# Patient Record
Sex: Male | Born: 1935 | Race: Black or African American | Hispanic: No | Marital: Married | Smoking: Never smoker
Health system: Southern US, Community
[De-identification: ages and names within clinical notes are randomized; demographics above are authoritative.]

## PROBLEM LIST (undated history)

## (undated) DIAGNOSIS — I639 Cerebral infarction, unspecified: Secondary | ICD-10-CM

## (undated) DIAGNOSIS — Z982 Presence of cerebrospinal fluid drainage device: Secondary | ICD-10-CM

## (undated) DIAGNOSIS — I10 Essential (primary) hypertension: Secondary | ICD-10-CM

## (undated) DIAGNOSIS — L89159 Pressure ulcer of sacral region, unspecified stage: Secondary | ICD-10-CM

## (undated) DIAGNOSIS — K219 Gastro-esophageal reflux disease without esophagitis: Secondary | ICD-10-CM

## (undated) DIAGNOSIS — K649 Unspecified hemorrhoids: Secondary | ICD-10-CM

## (undated) DIAGNOSIS — J45909 Unspecified asthma, uncomplicated: Secondary | ICD-10-CM

## (undated) DIAGNOSIS — N400 Enlarged prostate without lower urinary tract symptoms: Secondary | ICD-10-CM

---

## 2015-01-17 ENCOUNTER — Encounter: Payer: Medicare Other | Attending: Surgery | Admitting: Surgery

## 2015-01-17 DIAGNOSIS — E440 Moderate protein-calorie malnutrition: Secondary | ICD-10-CM | POA: Diagnosis not present

## 2015-01-17 DIAGNOSIS — L89154 Pressure ulcer of sacral region, stage 4: Secondary | ICD-10-CM | POA: Insufficient documentation

## 2015-01-17 DIAGNOSIS — L89144 Pressure ulcer of left lower back, stage 4: Secondary | ICD-10-CM | POA: Diagnosis not present

## 2015-01-17 DIAGNOSIS — G8190 Hemiplegia, unspecified affecting unspecified side: Secondary | ICD-10-CM | POA: Diagnosis not present

## 2015-01-17 DIAGNOSIS — Z87891 Personal history of nicotine dependence: Secondary | ICD-10-CM | POA: Diagnosis not present

## 2015-01-17 DIAGNOSIS — G919 Hydrocephalus, unspecified: Secondary | ICD-10-CM | POA: Diagnosis not present

## 2015-01-17 DIAGNOSIS — I10 Essential (primary) hypertension: Secondary | ICD-10-CM | POA: Insufficient documentation

## 2015-01-18 NOTE — Progress Notes (Signed)
[name] [name] ([id number]) Visit Report for 01/17/2015 Abuse/Suicide Risk Screen Details Patient Name: [name] [name] Date of Service: 01/17/2015 1:00 PM Medical Record Number: [id number] Patient Account Number: [id number] Date of Birth/Sex: May 01, 1931 (81 y.o. Male) Treating RN: Curtis Sites Primary Care Physician: Audree Bane Other Clinician: Referring Physician: Audree Bane Treating Physician/Extender: Rudene Re in Treatment: 0 Abuse/Suicide Risk Screen Items Answer ABUSE/SUICIDE RISK SCREEN: Has anyone close to you tried to hurt or harm you recentlyo No Do you feel uncomfortable with anyone in your familyo No Has anyone forced you do things that you didnot want to doo No Do you have any thoughts of harming yourselfo No Patient displays signs or symptoms of abuse and/or neglect. No Electronic Signature(s) Signed: 01/17/2015 4:43:54 PM By: Curtis Sites Entered By: Curtis Sites on 01/17/2015 13:54:09 [name] [name] ([id number]) -------------------------------------------------------------------------------- Activities of Daily Living Details Patient Name: [name] [name] Date of Service: 01/17/2015 1:00 PM Medical Record Number: [id number] Patient Account Number: [id number] Date of Birth/Sex: 1931-01-22 (81 y.o. Male) Treating RN: Curtis Sites Primary Care Physician: Audree Bane Other Clinician: Referring Physician: Audree Bane Treating Physician/Extender: Rudene Re in Treatment: 0 Activities of Daily Living Items Answer Activities of Daily Living (Please select one for each item) Drive Automobile Not Able Take Medications Need Assistance Use Telephone Need Assistance Care for Appearance Need Assistance Use Toilet Not Able Bath / Shower Need Assistance Dress Self Need Assistance Feed Self Need Assistance Walk Not Able Get In / Out Bed Need Assistance Housework Not Able Prepare Meals Need Assistance Handle Money Need Assistance Shop for Self Need  Assistance Electronic Signature(s) Signed: 01/17/2015 4:43:54 PM By: Curtis Sites Entered By: Curtis Sites on 01/17/2015 13:56:02 [name] [name] ([id number]) -------------------------------------------------------------------------------- Education Assessment Details Patient Name: [name] [name] Date of Service: 01/17/2015 1:00 PM Medical Record Number: [id number] Patient Account Number: [id number] Date of Birth/Sex: 01/13/1931 (81 y.o. Male) Treating RN: Curtis Sites Primary Care Physician: Audree Bane Other Clinician: Referring Physician: Audree Bane Treating Physician/Extender: Rudene Re in Treatment: 0 Primary Learner Assessed: Caregiver dtr Reason Patient is not Primary Learner: pt is post CVA Learning Preferences/Education Level/Primary Language Learning Preference: Explanation, Demonstration Highest Education Level: High School Preferred Language: English Cognitive Barrier Assessment/Beliefs Language Barrier: No Translator Needed: No Memory Deficit: No Emotional Barrier: No Cultural/Religious Beliefs Affecting Medical No Care: Physical Barrier Assessment Impaired Vision: No Impaired Hearing: No Decreased Hand dexterity: No Knowledge/Comprehension Assessment Knowledge Level: Medium Comprehension Level: Medium Ability to understand written Medium instructions: Ability to understand verbal Medium instructions: Motivation Assessment Anxiety Level: Calm Cooperation: Cooperative Education Importance: Acknowledges Need Interest in Health Problems: Asks Questions Perception: Coherent Willingness to Engage in Self- Medium Management Activities: Readiness to Engage in Self- Medium Management Activities: [name] [name] ([id number]) Electronic Signature(s) Signed: 01/17/2015 4:43:54 PM By: Curtis Sites Entered By: Curtis Sites on 01/17/2015 13:56:34 [name] [name]  ([id number]) -------------------------------------------------------------------------------- Fall Risk Assessment Details Patient Name: [name] [name] Date of Service: 01/17/2015 1:00 PM Medical Record Number: [id number] Patient Account Number: [id number] Date of Birth/Sex: [birth year] (81 y.o. Male) Treating RN: Curtis Sites Primary Care Physician: Audree Bane Other Clinician: Referring Physician: Audree Bane Treating Physician/Extender: Rudene Re in Treatment: 0 Fall Risk Assessment Items FALL RISK ASSESSMENT: History of falling - immediate or within 3 months 0 No Secondary diagnosis 0 No Ambulatory aid None/bed rest/wheelchair/nurse 0 Yes Crutches/cane/walker 0 No Furniture 0 No IV Access/Saline Lock 0 No Gait/Training Normal/bed rest/immobile 0 Yes Weak 0 No Impaired 0 No Mental Status Oriented to own ability 0 Yes Electronic Signature(s) Signed:  01/17/2015 4:43:54 PM By: Curtis Sitesorthy, [name] Entered By: Curtis Sitesorthy, [name] on 01/17/2015 13:56:47 [name] [name] ([id number]) -------------------------------------------------------------------------------- Nutrition Risk Assessment Details Patient Name: [name] [name] Date of Service: 01/17/2015 1:00 PM Medical Record Number: [id number] Patient Account Number: [id number] Date of Birth/Sex: 05/13/1931 4(81 y.o. Male) Treating RN: Curtis Sitesorthy, [name] Primary Care Physician: Audree BaneKING, PETER Other Clinician: Referring Physician: Audree BaneKING, PETER Treating Physician/Extender: Rudene ReBritto, Errol Weeks in Treatment: 0 Height (in): Weight (lbs): Body Mass Index (BMI): Nutrition Risk Assessment Items NUTRITION RISK SCREEN: I have an illness or condition that made me change the kind and/or 0 No amount of food I eat I eat fewer than two meals per day 0 No I eat few fruits and vegetables, or milk products 0 No I have three or more drinks of beer, liquor or wine almost every day 0 No I have tooth or mouth problems that make it hard for me to eat 0 No I  don't always have enough money to buy the food I need 0 No I eat alone most of the time 0 No I take three or more different prescribed or over-the-counter drugs a 1 Yes day Without wanting to, I have lost or gained 10 pounds in the last six 0 No months I am not always physically able to shop, cook and/or feed myself 2 Yes Nutrition Protocols Good Risk Protocol Provide education on Moderate Risk Protocol 0 nutrition Electronic Signature(s) Signed: 01/17/2015 4:43:54 PM By: Curtis Sitesorthy, [name] Entered By: Curtis Sitesorthy, [name] on 01/17/2015 13:57:12

## 2015-01-18 NOTE — Progress Notes (Signed)
[name] [name], [name] [name] ([id number]) Visit Report for 01/17/2015 Allergy List Details Patient Name: [name] [name], [name] [name] Date of Service: 01/17/2015 1:00 PM Medical Record Number: [id number] Patient Account Number: [id number] Date of Birth/Sex: February 01, 1931 (81 y.o. Male) Treating RN: Curtis Sites Primary Care Physician: Audree Bane Other Clinician: Referring Physician: Audree Bane Treating Physician/Extender: Rudene Re in Treatment: 0 Allergies Active Allergies No Known Allergies Allergy Notes Electronic Signature(s) Signed: 01/17/2015 4:43:54 PM By: Curtis Sites Entered By: Curtis Sites on 01/17/2015 13:48:20 [name] [name], [name] [name] ([id number]) -------------------------------------------------------------------------------- Arrival Information Details Patient Name: [name] [name], [name] [name] Date of Service: 01/17/2015 1:00 PM Medical Record Number: [id number] Patient Account Number: [id number] Date of Birth/Sex: April 03, 1931 (81 y.o. Male) Treating RN: Huel Coventry Primary Care Physician: Audree Bane Other Clinician: Referring Physician: Audree Bane Treating Physician/Extender: Rudene Re in Treatment: 0 Visit Information Patient Arrived: Wheel Chair Arrival Time: 13:15 Accompanied By: daughter, Theotis Transfer Assistance: Michiel Sites Lift Patient Identification Verified: Yes Secondary Verification Process Yes Completed: Patient Has Alerts: Yes Electronic Signature(s) Signed: 01/17/2015 4:48:19 PM By: Elliot Gurney, RN, BSN, Kim RN, BSN Entered By: Elliot Gurney, RN, BSN, Kim on 01/17/2015 13:32:30 Lyles, Dario ([id number]) -------------------------------------------------------------------------------- Clinic Level of Care Assessment Details Patient Name: [name] [name], [name] [name] Date of Service: 01/17/2015 1:00 PM Medical Record Number: [id number] Patient Account Number: [id number] Date of Birth/Sex: 10-Apr-1931 (81 y.o. Male) Treating RN: Curtis Sites Primary Care Physician: Audree Bane Other Clinician: Referring Physician:  Audree Bane Treating Physician/Extender: Rudene Re in Treatment: 0 Clinic Level of Care Assessment Items TOOL 2 Quantity Score  - Use when only an EandM is performed on the INITIAL visit 0 ASSESSMENTS - Nursing Assessment / Reassessment X - General Physical Exam (combine w/ comprehensive assessment (listed just 1 20 below) when performed on new pt. evals) X - Comprehensive Assessment (HX, ROS, Risk Assessments, Wounds Hx, etc.) 1 25 ASSESSMENTS - Wound and Skin Assessment / Reassessment X - Simple Wound Assessment / Reassessment - one wound 1 5  - Complex Wound Assessment / Reassessment - multiple wounds 0  - Dermatologic / Skin Assessment (not related to wound area) 0 ASSESSMENTS - Ostomy and/or Continence Assessment and Care  - Incontinence Assessment and Management 0  - Ostomy Care Assessment and Management (repouching, etc.) 0 PROCESS - Coordination of Care X - Simple Patient / Family Education for ongoing care 1 15  - Complex (extensive) Patient / Family Education for ongoing care 0 X - Staff obtains Chiropractor, Records, Test Results / Process Orders 1 10  - Staff telephones HHA, Nursing Homes / Clarify orders / etc 0  - Routine Transfer to another Facility (non-emergent condition) 0  - Routine Hospital Admission (non-emergent condition) 0 X - New Admissions / Manufacturing engineer / Ordering NPWT, Apligraf, etc. 1 15  - Emergency Hospital Admission (emergent condition) 0 X - Simple Discharge Coordination 1 10 [name] [name], [name] [name] ([id number])  - Complex (extensive) Discharge Coordination 0 PROCESS - Special Needs  - Pediatric / Minor Patient Management 0  - Isolation Patient Management 0  - Hearing / Language / Visual special needs 0  - Assessment of Community assistance (transportation, D/C planning, etc.) 0  - Additional assistance / Altered mentation 0  - Support Surface(s) Assessment (bed, cushion, seat, etc.) 0 INTERVENTIONS - Wound  Cleansing / Measurement X - Wound Imaging (photographs - any number of wounds) 1 5  - Wound Tracing (instead of photographs) 0 X - Simple Wound Measurement - one wound 1 5  - Complex Wound Measurement - multiple wounds 0 X - Simple Wound Cleansing -  one wound 1 5 []  - Complex Wound Cleansing - multiple wounds 0 INTERVENTIONS - Wound Dressings []  - Small Wound Dressing one or multiple wounds 0 X - Medium Wound Dressing one or multiple wounds 1 15 []  - Large Wound Dressing one or multiple wounds 0 []  - Application of Medications - injection 0 INTERVENTIONS - Miscellaneous []  - External ear exam 0 []  - Specimen Collection (cultures, biopsies, blood, body fluids, etc.) 0 []  - Specimen(s) / Culture(s) sent or taken to Lab for analysis 0 []  - Patient Transfer (multiple staff / Michiel Sites Lift / Similar devices) 0 []  - Simple Staple / Suture removal (25 or less) 0 []  - Complex Staple / Suture removal (26 or more) 0 [name] [name], [name] [name] ([id number]) []  - Hypo / Hyperglycemic Management (close monitor of Blood Glucose) 0 []  - Ankle / Brachial Index (ABI) - do not check if billed separately 0 Has the patient been seen at the hospital within the last three years: Yes Total Score: 130 Level Of Care: New/Established - Level 4 Electronic Signature(s) Signed: 01/17/2015 4:36:47 PM By: Curtis Sites Entered By: Curtis Sites on 01/17/2015 16:36:47 [name] [name], [name] [name] ([id number]) -------------------------------------------------------------------------------- Encounter Discharge Information Details Patient Name: [name] [name], [name] [name] Date of Service: 01/17/2015 1:00 PM Medical Record Number: [id number] Patient Account Number: [id number] Date of Birth/Sex: 08/18/1930 (81 y.o. Male) Treating RN: Curtis Sites Primary Care Physician: Audree Bane Other Clinician: Referring Physician: Audree Bane Treating Physician/Extender: Rudene Re in Treatment: 0 Encounter Discharge Information Items Discharge Pain Level:  0 Discharge Condition: Stable Ambulatory Status: Wheelchair Nursing Discharge Destination: Home Transportation: Private Auto Accompanied By: dtr Schedule Follow-up Appointment: Yes Medication Reconciliation completed No and provided to Patient/Care Metha Kolasa: Clinical Summary of Care: Electronic Signature(s) Signed: 01/17/2015 4:43:54 PM By: Curtis Sites Entered By: Curtis Sites on 01/17/2015 14:39:10 Winborne, James ([id number]) -------------------------------------------------------------------------------- Lower Extremity Assessment Details Patient Name: [name] [name], [name] [name] Date of Service: 01/17/2015 1:00 PM Medical Record Number: [id number] Patient Account Number: [id number] Date of Birth/Sex: 01-03-1931 (81 y.o. Male) Treating RN: Huel Coventry Primary Care Physician: Audree Bane Other Clinician: Referring Physician: Audree Bane Treating Physician/Extender: Rudene Re in Treatment: 0 Electronic Signature(s) Signed: 01/17/2015 4:48:19 PM By: Elliot Gurney, RN, BSN, Kim RN, BSN Entered By: Elliot Gurney, RN, BSN, Kim on 01/17/2015 13:40:47 [name] [name], [name] [name] ([id number]) -------------------------------------------------------------------------------- Multi Wound Chart Details Patient Name: [name] [name], [name] [name] Date of Service: 01/17/2015 1:00 PM Medical Record Number: [id number] Patient Account Number: [id number] Date of Birth/Sex: June 24, 1931 (81 y.o. Male) Treating RN: Curtis Sites Primary Care Physician: Audree Bane Other Clinician: Referring Physician: Audree Bane Treating Physician/Extender: Rudene Re in Treatment: 0 Vital Signs Height(in): Pulse(bpm): 113 Weight(lbs): Blood Pressure 131/42 (mmHg): Body Mass Index(BMI): Temperature(F): 98.1 Respiratory Rate 20 (breaths/min): Photos: [1:No Photos] [N/A:N/A] Wound Location: [1:Sacrum - Medial] [N/A:N/A] Wounding Event: [1:Gradually Appeared] [N/A:N/A] Primary Etiology: [1:Pressure Ulcer] [N/A:N/A] Date Acquired: [1:09/24/2014]  [N/A:N/A] Weeks of Treatment: [1:0] [N/A:N/A] Wound Status: [1:Open] [N/A:N/A] Measurements L x W x D 12.8x9.2x1.3 [N/A:N/A] (cm) Area (cm) : [1:92.488] [N/A:N/A] Volume (cm) : [1:120.235] [N/A:N/A] % Reduction in Area: [1:0.00%] [N/A:N/A] % Reduction in Volume: 0.00% [N/A:N/A] Position 1 (o'clock): 12 Maximum Distance 1 6.5 (cm): Tunneling: [1:Yes] [N/A:N/A] Classification: [1:Category/Stage III] [N/A:N/A] Exudate Amount: [1:Large] [N/A:N/A] Exudate Type: [1:Serous] [N/A:N/A] Exudate Color: [1:amber] [N/A:N/A] Wound Margin: [1:Flat and Intact] [N/A:N/A] Granulation Amount: [1:Large (67-100%)] [N/A:N/A] Granulation Quality: [1:Red] [N/A:N/A] Necrotic Amount: [1:Small (1-33%)] [N/A:N/A] Exposed Structures: [1:Fascia: No Fat: No Tendon: No Muscle: No Joint: No] [N/A:N/A] Bone: No Limited to Skin Breakdown Epithelialization: None N/A N/A Periwound Skin Texture:  Edema: No N/A N/A Excoriation: No Induration: No Callus: No Crepitus: No Fluctuance: No Friable: No Rash: No Scarring: No Periwound Skin Maceration: No N/A N/A Moisture: Moist: No Dry/Scaly: No Periwound Skin Color: Atrophie Blanche: No N/A N/A Cyanosis: No Ecchymosis: No Erythema: No Hemosiderin Staining: No Mottled: No Pallor: No Rubor: No Tenderness on Yes N/A N/A Palpation: Wound Preparation: Ulcer Cleansing: N/A N/A Rinsed/Irrigated with Saline Topical Anesthetic Applied: Other: lidocaine 4% Treatment Notes Electronic Signature(s) Signed: 01/17/2015 4:43:54 PM By: Curtis Sitesorthy, Joanna Entered By: Curtis Sitesorthy, Joanna on 01/17/2015 14:10:03 Homesley, Aadil ([id number]) -------------------------------------------------------------------------------- Multi-Disciplinary Care Plan Details Patient Name: [name] [name], [name] [name] Date of Service: 01/17/2015 1:00 PM Medical Record Number: [id number] Patient Account Number: [id number] Date of Birth/Sex: 08/16/1930 34(81 y.o. Male) Treating RN: Curtis Sitesorthy, Joanna Primary Care  Physician: Audree BaneKING, PETER Other Clinician: Referring Physician: Audree BaneKING, PETER Treating Physician/Extender: Rudene ReBritto, Errol Weeks in Treatment: 0 Active Inactive Orientation to the Wound Care Program Nursing Diagnoses: Knowledge deficit related to the wound healing center program Goals: Patient/caregiver will verbalize understanding of the Wound Healing Center Program Date Initiated: 01/17/2015 Goal Status: Active Interventions: Provide education on orientation to the wound center Notes: Pressure Nursing Diagnoses: Potential for impaired tissue integrity related to pressure, friction, moisture, and shear Goals: Patient will remain free from development of additional pressure ulcers Date Initiated: 01/17/2015 Goal Status: Active Interventions: Provide education on pressure ulcers Notes: Wound/Skin Impairment Nursing Diagnoses: Impaired tissue integrity Goals: Ulcer/skin breakdown will have a volume reduction of 30% by week 4 Date Initiated: 01/17/2015 Henken, Jeanmarc ([id number]) Goal Status: Active Interventions: Assess ulceration(s) every visit Notes: Electronic Signature(s) Signed: 01/17/2015 4:43:54 PM By: Curtis Sitesorthy, Joanna Entered By: Curtis Sitesorthy, Joanna on 01/17/2015 14:09:51 Kudo, Mosie ([id number]) -------------------------------------------------------------------------------- Pain Assessment Details Patient Name: [name] [name], [name] [name] Date of Service: 01/17/2015 1:00 PM Medical Record Number: [id number] Patient Account Number: [id number] Date of Birth/Sex: 12/16/1930 [age] y.o. Male) Treating RN: Huel CoventryWoody, Kim Primary Care Physician: Audree BaneKING, PETER Other Clinician: Referring Physician: Audree BaneKING, PETER Treating Physician/Extender: Rudene ReBritto, Errol Weeks in Treatment: 0 Active Problems Location of Pain Severity and Description of Pain Patient Has Paino Yes Site Locations Pain Location: Pain in Ulcers With Dressing Change: Yes Duration of the Pain. Constant / Intermittento Constant Pain Management and  Medication Current Pain Management: Notes Patient is in pain. But due to speech, unable to understand exactly where the pain is located Psychologist, prison [facility] probation services[facility] Signature(s) Signed: 01/17/2015 4:48:19 PM By: Elliot GurneyWoody, RN, BSN, Kim RN, BSN Entered By: Elliot GurneyWoody, RN, BSN, Kim on 01/17/2015 13:34:29 [name] [name], [name] [name] ([id number]) -------------------------------------------------------------------------------- Patient/Caregiver Education Details Patient Name: [name] [name], Enes Date of Service: 01/17/2015 1:00 PM Medical Record Number: [id number] Patient Account Number: [id number] Date of Birth/Gender: 01/27/1931 [age] y.o. Male) Treating RN: Curtis Sitesorthy, Joanna Primary Care Physician: Audree BaneKING, PETER Other Clinician: Referring Physician: Audree BaneKING, PETER Treating Physician/Extender: Rudene ReBritto, Errol Weeks in Treatment: 0 Education Assessment Education Provided To: Caregiver Education Topics Provided Wound/Skin Impairment: Handouts: Other: wound care as ordered Methods: Demonstration, Explain/Verbal Responses: State content correctly Electronic Signature(s) Signed: 01/17/2015 4:43:54 PM By: Curtis Sitesorthy, Joanna Entered By: Curtis Sitesorthy, Joanna on 01/17/2015 14:39:26 Fabry, Corney ([id number]) -------------------------------------------------------------------------------- Wound Assessment Details Patient Name: Kaczmarek, Raheel Date of Service: 01/17/2015 1:00 PM Medical Record Number: [id number] Patient Account Number: [id number] Date of Birth/Sex: 11/11/1930 [age] y.o. Male) Treating RN: Curtis Sitesorthy, Joanna Primary Care Physician: Audree BaneKING, PETER Other Clinician: Referring Physician: Audree BaneKING, PETER Treating Physician/Extender: Rudene ReBritto, Errol Weeks in Treatment: 0 Wound Status Wound Number: 1 Primary Pressure Ulcer Etiology: Wound Location: Sacrum - Medial Wound Open Wounding Event: Gradually Appeared Status: Date Acquired: 09/24/2014 Comorbid Cataracts, Glaucoma, Anemia,  Weeks Of Treatment: 0 History: Hypertension, Osteoarthritis Clustered Wound:  No Photos Photo Uploaded By: Curtis Sites on 01/17/2015 16:15:40 Wound Measurements Length: (cm) 12.8 Width: (cm) 9.2 Depth: (cm) 1.3 Area: (cm) 92.488 Volume: (cm) 120.235 % Reduction in Area: 0% % Reduction in Volume: 0% Epithelialization: None Tunneling: No Undermining: Yes Starting Position (o'clock): 12 Ending Position (o'clock): 4 Maximum Distance: (cm) 7 Wound Description Classification: Category/Stage III Wound Margin: Flat and Intact Exudate Amount: Large Exudate Type: Serous Exudate Color: amber Foul Odor After Cleansing: No Wound Bed Brassfield, Heman ([id number]) Granulation Amount: Large (67-100%) Exposed Structure Granulation Quality: Red Fascia Exposed: No Necrotic Amount: Small (1-33%) Fat Layer Exposed: No Necrotic Quality: Adherent Slough Tendon Exposed: No Muscle Exposed: No Joint Exposed: No Bone Exposed: No Limited to Skin Breakdown Periwound Skin Texture Texture Color No Abnormalities Noted: No No Abnormalities Noted: No Callus: No Atrophie Blanche: No Crepitus: No Cyanosis: No Excoriation: No Ecchymosis: No Fluctuance: No Erythema: No Friable: No Hemosiderin Staining: No Induration: No Mottled: No Localized Edema: No Pallor: No Rash: No Rubor: No Scarring: No Temperature / Pain Moisture Tenderness on Palpation: Yes No Abnormalities Noted: No Dry / Scaly: No Maceration: No Moist: No Wound Preparation Ulcer Cleansing: Rinsed/Irrigated with Saline Topical Anesthetic Applied: Other: lidocaine 4%, Treatment Notes Wound #1 (Medial Sacrum) 1. Cleansed with: Clean wound with Normal Saline 2. Anesthetic Topical Lidocaine 4% cream to wound bed prior to debridement 4. Dressing Applied: Other dressing (specify in notes) 5. Secondary Dressing Applied ABD Pad Notes drawtex Electronic Signature(s) Signed: 01/17/2015 4:43:54 PM By: Curtis Sites Waterworth, Hebert ([id number]) Entered By: Curtis Sites on 01/17/2015  14:14:25 Paradiso, Zaryan ([id number]) -------------------------------------------------------------------------------- Vitals Details Patient Name: Lasker, Maeson Date of Service: 01/17/2015 1:00 PM Medical Record Number: [id number] Patient Account Number: [id number] Date of Birth/Sex: 1931-04-04 (81 y.o. Male) Treating RN: Huel Coventry Primary Care Physician: Audree Bane Other Clinician: Referring Physician: Audree Bane Treating Physician/Extender: Rudene Re in Treatment: 0 Vital Signs Time Taken: 13:34 Temperature (F): 98.1 Pulse (bpm): 113 Respiratory Rate (breaths/min): 20 Blood Pressure (mmHg): 131/42 Reference Range: 80 - 120 mg / dl Electronic Signature(s) Signed: 01/17/2015 4:48:19 PM By: Elliot Gurney, RN, BSN, Kim RN, BSN Entered By: Elliot Gurney, RN, BSN, Kim on 01/17/2015 13:40:38

## 2015-01-20 NOTE — Progress Notes (Signed)
[name], [name] ([id number]) Visit Report for 01/17/2015 Chief Complaint Document Details Patient Name: [name] [name], [name] [name] Date of Service: 01/17/2015 1:00 PM Medical Record Number: [id number] Patient Account Number: [id number] Date of Birth/Sex: 02/03/1931 [age] y.o. Male) Treating RN: Primary Care Physician: Audree BaneKING, PETER Other Clinician: Referring Physician: Audree BaneKING, PETER Treating Physician/Extender: Rudene ReBritto, [name]: 0 Information Obtained from: Caregiver Chief Complaint Patient is at the clinic for treatment of an open pressure ulcer. The patient comes along with his daughter who is the caregiver and lives in an assisted living facility and has had a ulcerative area on the sacrum for the last 4 months. Electronic Signature(s) Signed: 01/17/2015 2:51:21 PM By: Evlyn KannerBritto, Donnisha Besecker MD, FACS Entered By: Evlyn KannerBritto, Kole Hilyard on 01/17/2015 14:51:21 [name], [name] ([id number]) -------------------------------------------------------------------------------- HPI Details Patient Name: [name], [name] Date of Service: 01/17/2015 1:00 PM Medical Record Number: [id number] Patient Account Number: [id number] Date of Birth/Sex: 10/28/1930 [age] y.o. Male) Treating RN: Primary Care Physician: Audree BaneKING, PETER Other Clinician: Referring Physician: Audree BaneKING, PETER Treating Physician/Extender: Rudene ReBritto, Leaann Nevils Weeks in Treatment: 0 History of Present Illness Location: pressure injury to the sacral region and the left low back area. Quality: Patient reports experiencing a dull pain to affected area(s). Severity: Patient states wound are getting worse. Duration: Patient has had the wound for > 3 months prior to seeking treatment at the wound center Timing: Pain in wound is Intermittent (comes and goes Context: The wound appeared gradually over time Modifying Factors: Consults to this date include: wound VAC applications and local care Associated Signs and Symptoms: Wound has significant periowound erythema and localized edema HPI  Description: 81 year old gentleman who had a stroke in February of this year and has been bedbound since then. Since March he has developed a pressure injury and ulceration and has been treated at assisted living with a wound VAC and other supportive care. His had a PEG tube in place and though he eats some food during the day he gets supplementary PEG tube feeds between 6 PM and 6 AM. Has not had diabetes mellitus and was pretty active before his illness. Electronic Signature(s) Signed: 01/17/2015 2:53:06 PM By: Evlyn KannerBritto, Ion Gonnella MD, FACS Entered By: Evlyn KannerBritto, Nayla Dias on 01/17/2015 14:53:06 [name], [name] -------------------------------------------------------------------------------- Physical Exam Details Patient Name: [name], [name] Date of Service: 01/17/2015 1:00 PM Medical Record Number: [id number] Patient Account Number: [id number] Date of Birth/Sex: 07/09/1931 [age] y.o. Male) Treating RN: Primary Care Physician: Audree BaneKING, PETER Other Clinician: Referring Physician: Audree BaneKING, PETER Treating Physician/Extender: Rudene ReBritto, Velma Agnes Weeks in Treatment: 0 Constitutional . Pulse regular. Respirations normal and unlabored. Afebrile. . Eyes Nonicteric. Reactive to light. Ears, Nose, Mouth, and Throat Lips, teeth, and gums WNL.Marland Kitchen. Moist mucosa without lesions . Neck supple and nontender. No palpable supraclavicular or cervical adenopathy. Normal sized without goiter. Respiratory WNL. No retractions.. Cardiovascular Pedal Pulses WNL. No clubbing, cyanosis or edema. Gastrointestinal (GI) Abdomen without masses or tenderness.. No liver or spleen enlargement or tenderness.. Musculoskeletal Adexa without tenderness or enlargement.. Digits and nails w/o clubbing, cyanosis, infection, petechiae, ischemia, or inflammatory conditions.. Integumentary (Hair, Skin) No suspicious lesions. No crepitus or fluctuance. No peri-wound warmth or erythema. No masses.Marland Kitchen. Psychiatric Judgement and insight Intact.. No  evidence of depression, anxiety, or agitation.. Notes Large stage IV decubitus ulcer of the sacrum and adjoining left low back. The superior part of both ulcerations undermine up to 7 cm and this is mainly between the 12:00 and 3:00 position on the right and on the left side it is approximately between the 7:00 and the 1:00 position. The base  of the ulcer looks fairly clean and is not probing down to bone but muscle aches exposed all through. Electronic Signature(s) Signed: 01/17/2015 2:54:27 PM By: Evlyn Kanner MD, FACS Entered By: Evlyn Kanner on 01/17/2015 14:54:26 [name], [name] -------------------------------------------------------------------------------- Physician Orders Details Patient Name: [name], [name] Date of Service: 01/17/2015 1:00 PM Medical Record Number: [id number] Patient Account Number: [id number] Date of Birth/Sex: 1930-10-17 (81 y.o. Male) Treating RN: Curtis Sites Primary Care Physician: Audree Bane Other Clinician: Referring Physician: Audree Bane Treating Physician/Extender: Rudene Re in Treatment: 0 Verbal / Phone Orders: Yes Clinician: Curtis Sites Read Back and Verified: Yes Diagnosis Coding Wound Cleansing Wound #1 Medial Sacrum o Clean wound with Normal Saline. Anesthetic Wound #1 Medial Sacrum o Topical Lidocaine 4% cream applied to wound bed prior to debridement Skin Barriers/Peri-Wound Care Wound #1 Medial Sacrum o Skin Prep Primary Wound Dressing o Iodoform packing Gauze - may use iodoform packing strip to undermining at the top of wound along with green foam and NPWT until drawtex is available o Drawtex - strips of drawtex to undermining at the top of wound with green foam to wound bed with NPWT Secondary Dressing Wound #1 Medial Sacrum o ABD pad - applied at Wound Center; [facility] to reapply NPWT today Dressing Change Frequency Wound #1 Medial Sacrum o Change Dressing Monday, Wednesday,  Friday Follow-up Appointments Wound #1 Medial Sacrum o Return Appointment in 1 week. Additional Orders / Instructions Wound #1 Medial Sacrum o Other: - please add zinc, selenium and vitamin c to patient's diet/medictions Negative Pressure Wound Therapy [name], [name] Wound #1 Medial Sacrum o Wound VAC settings at 125/130 mmHg continuous pressure. Use BLACK/GREEN foam to wound cavity. Use WHITE foam to fill any tunnel/s and/or undermining. Change VAC dressing 3 X WEEK. Change canister as indicated when full. Nurse may titrate settings and frequency of dressing changes as clinically indicated. o Home Health Nurse may d/c VAC for s/s of increased infection, significant wound regression, or uncontrolled drainage. Notify Wound Healing Center at [phone]. o Number of foam/gauze pieces used in the dressing = Consults o Plastic Surgery - this will be set up by [facility] oooo Electronic Signature(s) Signed: 01/17/2015 4:43:54 PM By: Curtis Sites Signed: 01/20/2015 12:34:21 PM By: Evlyn Kanner MD, FACS Entered By: Curtis Sites on 01/17/2015 14:26:35 [name], [name] ([id number]) -------------------------------------------------------------------------------- Problem List Details Patient Name: Hampe, Arhum Date of Service: 01/17/2015 1:00 PM Medical Record Number: [id number] Patient Account Number: [id number] Date of Birth/Sex: 22-Mar-1931 (81 y.o. Male) Treating RN: Primary Care Physician: Audree Bane Other Clinician: Referring Physician: Audree Bane Treating Physician/Extender: Rudene Re in Treatment: 0 Active Problems ICD-10 Encounter Code Description Active Date Diagnosis L89.154 Pressure ulcer of sacral region, stage 4 01/17/2015 Yes L89.144 Pressure ulcer of left lower back, stage 4 01/17/2015 Yes G81.90 Hemiplegia, unspecified affecting unspecified side 01/17/2015 Yes E44.0 Moderate protein-calorie malnutrition 01/17/2015 Yes Inactive  Problems Resolved Problems Electronic Signature(s) Signed: 01/17/2015 2:50:44 PM By: Evlyn Kanner MD, FACS Entered By: Evlyn Kanner on 01/17/2015 14:50:43 Gilcrease, Mike ([id number]) -------------------------------------------------------------------------------- Progress Note Details Patient Name: [name], [name] Date of Service: 01/17/2015 1:00 PM Medical Record Number: [id number] Patient Account Number: [id number] Date of Birth/Sex: 25-Oct-1930 (81 y.o. Male) Treating RN: Primary Care Physician: Audree Bane Other Clinician: Referring Physician: Audree Bane Treating Physician/Extender: Rudene Re in Treatment: 0 Subjective Chief Complaint Information obtained from Caregiver Patient is at the clinic for treatment of an open pressure ulcer. The patient comes along with his daughter who is the  caregiver and lives in an assisted living facility and has had a ulcerative area on the sacrum for the last 4 months. History of Present Illness (HPI) The following HPI elements were documented for the patient's wound: Location: pressure injury to the sacral region and the left low back area. Quality: Patient reports experiencing a dull pain to affected area(s). Severity: Patient states wound are getting worse. Duration: Patient has had the wound for > 3 months prior to seeking treatment at the wound center Timing: Pain in wound is Intermittent (comes and goes Context: The wound appeared gradually over time Modifying Factors: Consults to this date include: wound VAC applications and local care Associated Signs and Symptoms: Wound has significant periowound erythema and localized edema 81 year old gentleman who had a stroke in February of this year and has been bedbound since then. Since March he has developed a pressure injury and ulceration and has been treated at assisted living with a wound VAC and other supportive care. His had a PEG tube in place and though he eats some food during the  day he gets supplementary PEG tube feeds between 6 PM and 6 AM. Has not had diabetes mellitus and was pretty active before his illness. Wound History Patient presents with 1 open wound that has been present for approximately since March. Patient has been treating wound in the following manner: NPWT. Laboratory tests have not been performed in the last month. Patient reportedly has not tested positive for an antibiotic resistant organism. Patient reportedly has not tested positive for osteomyelitis. Patient reportedly has not had testing performed to evaluate circulation in the legs. Patient History Information obtained from Patient, Caregiver. Allergies No Known Allergies [name], [name] Family History Cancer - Siblings, Kidney Disease - Siblings, Stroke - Siblings, No family history of Diabetes, Heart Disease, Hereditary Spherocytosis, Hypertension, Lung Disease, Seizures, Thyroid Problems, Tuberculosis. Social History Former smoker - cigars when younger, Marital Status - Married, Alcohol Use - Never, Drug Use - No History, Caffeine Use - Never. Medical History Eyes Patient has history of Cataracts, Glaucoma Hematologic/Lymphatic Patient has history of Anemia Cardiovascular Patient has history of Hypertension Musculoskeletal Patient has history of Osteoarthritis Denies history of Rheumatoid Arthritis Oncologic Denies history of Received Chemotherapy, Received Radiation Hospitalization/Surgery History - 08/28/2014, [facility] in [place], CVA. Medical And Surgical History Notes Gastrointestinal dysphagia with g tube Neurologic CVA with hemiplegia, hydrocephalus with shunt Review of Systems (ROS) Constitutional Symptoms (General Health) The patient has no complaints or symptoms. Ear/Nose/Mouth/Throat The patient has no complaints or symptoms. Hematologic/Lymphatic The patient has no complaints or symptoms. Respiratory The patient has no complaints or  symptoms. Gastrointestinal The patient has no complaints or symptoms. Endocrine The patient has no complaints or symptoms. Genitourinary Complains or has symptoms of Incontinence/dribbling. Immunological The patient has no complaints or symptoms. Integumentary (Skin) The patient has no complaints or symptoms. Delaluz, Kalel ([id number]) Medications vitamin C G-tube 1 1 tablet G-tube sulfamethoxazole 800 mg-trimethoprim 160 mg tablet oral 1 1 tablet oral acetaminophen 500 mg tablet oral 1 1 tablet oral Roxicodone 5 mg tablet oral tablet oral gabapentin 100 mg capsule oral 2 2 capsule oral Lamictal 25 mg tablet oral 1 1 tablet oral loperamide 1 mg/5 mL oral liquid oral liquid oral hydralazine 25 mg tablet oral 3 3 tablet oral amlodipine 10 mg tablet oral 1 1 tablet oral FerrouSul 325 mg (65 mg iron) tablet oral 1 1 tablet oral Senna-S 8.6 mg-50 mg tablet oral 2 2 tablet oral latanoprost 0.005 % eye drops  ophthalmic drops ophthalmic Simbrinza 1 %-0.2 % eye drops,suspension ophthalmic drops,suspension ophthalmic fluticasone 50 mcg/actuation nasal spray,suspension nasal 1 1 spray,suspension nasal ibuprofen 400 mg tablet oral tablet oral melatonin 3 mg tablet oral 1 1 tablet oral potassium chloride 20 mEq oral packet oral 1 1 packet oral lansoprazole 30 mg delayed release,disintegrating tablet oral 1 1 tablet,disintegrat, delay rel oral Normal Saline Flush injection syringe injection syringe injection clotrimazole 1 % topical cream topical cream topical Vitamin D3 1,000 unit tablet oral 1 1 tablet oral Objective Constitutional Pulse regular. Respirations normal and unlabored. Afebrile. Vitals Time Taken: 1:34 PM, Temperature: 98.1 F, Pulse: 113 bpm, Respiratory Rate: 20 breaths/min, Blood Pressure: 131/42 mmHg. Eyes Nonicteric. Reactive to light. Ears, Nose, Mouth, and Throat Lips, teeth, and gums WNL.Marland Kitchen Moist mucosa without lesions . Neck supple and nontender. No palpable  supraclavicular or cervical adenopathy. Normal sized without goiter. Respiratory WNL. No retractions.. [name], [name] ([id number]) Cardiovascular Pedal Pulses WNL. No clubbing, cyanosis or edema. Gastrointestinal (GI) Abdomen without masses or tenderness.. No liver or spleen enlargement or tenderness.. Musculoskeletal Adexa without tenderness or enlargement.. Digits and nails w/o clubbing, cyanosis, infection, petechiae, ischemia, or inflammatory conditions.Marland Kitchen Psychiatric Judgement and insight Intact.. No evidence of depression, anxiety, or agitation.. 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 The superior part of both ulcerations undermine up to 7 cm and this is mainly between the 12:00 and 3:00 position on the right and on the left side it is approximately between the 7:00 and the 1:00 position. The base of the ulcer looks fairly clean and is not probing down to bone but muscle aches exposed all through. Integumentary (Hair, Skin) No suspicious lesions. No crepitus or fluctuance. No peri-wound warmth or erythema. No masses.. Wound #1 status is Open. Original cause of wound was Gradually Appeared. The wound is located on the Medial Sacrum. The wound measures 12.8cm length x 9.2cm width x 1.3cm depth; 92.488cm^2 area and 120.235cm^3 volume. The wound is limited to skin breakdown. There is no tunneling noted, however, there is undermining starting at 12:00 and ending at 4:00 with a maximum distance of 7cm. There is a large amount of serous drainage noted. The wound margin is flat and intact. There is large (67-100%) red granulation within the wound bed. There is a small (1-33%) amount of necrotic tissue within the wound bed including Adherent Slough. The periwound skin appearance did not exhibit: Callus, Crepitus, Excoriation, Fluctuance, Friable, Induration, Localized Edema, Rash, Scarring, Dry/Scaly, Maceration, Moist, Atrophie Blanche,  Cyanosis, Ecchymosis, Hemosiderin Staining, Mottled, Pallor, Rubor, Erythema. The periwound has tenderness on palpation. Assessment Active Problems ICD-10 L89.154 - Pressure ulcer of sacral region, stage 4 L89.144 - Pressure ulcer of left lower back, stage 4 G81.90 - Hemiplegia, unspecified affecting unspecified side E44.0 - Moderate protein-calorie malnutrition [name], [name] ([id number]) In the areas of undermining I have recommended strips of drop attacks so as to help draw out the fluid from underneath day and then to apply a wound VAC as before. I have also recommended that the patient obtained a consultation with the plastic surgeons at [facility] where he is other care is being taken care of. Nutrition has been addressed and vitamin supplements including micronutrients have been discussed. Pressure offloading has been discussed in great detail especially the type of air mattress and the question for his wheelchair. Her toe was at the bedside is at all questions answered and will come back and see as an regular basis. Plan Wound Cleansing: Wound #  1 Medial Sacrum: Clean wound with Normal Saline. Anesthetic: Wound #1 Medial Sacrum: Topical Lidocaine 4% cream applied to wound bed prior to debridement Skin Barriers/Peri-Wound Care: Wound #1 Medial Sacrum: Skin Prep Primary Wound Dressing: Iodoform packing Gauze - may use iodoform packing strip to undermining at the top of wound along with green foam and NPWT until drawtex is available Drawtex - strips of drawtex to undermining at the top of wound with green foam to wound bed with NPWT Secondary Dressing: Wound #1 Medial Sacrum: ABD pad - applied at Wound Center; [facility] to reapply NPWT today Dressing Change Frequency: Wound #1 Medial Sacrum: Change Dressing Monday, Wednesday, Friday Follow-up Appointments: Wound #1 Medial Sacrum: Return Appointment in 1 week. Additional Orders / Instructions: Wound #1 Medial Sacrum: Other:  - please add zinc, selenium and vitamin c to patient's diet/medictions Negative Pressure Wound Therapy: Wound #1 Medial Sacrum: Wound VAC settings at 125/130 mmHg continuous pressure. Use BLACK/GREEN foam to wound cavity. Use WHITE foam to fill any tunnel/s and/or undermining. Change VAC dressing 3 X WEEK. Change canister as indicated when full. Nurse may titrate settings and frequency of dressing changes as clinically indicated. Home Health Nurse may d/c VAC for s/s of increased infection, significant wound regression, or uncontrolled drainage. Notify Wound Healing Center at [phone]. Number of foam/gauze pieces used in the dressing = Consults ordered were: Plastic Surgery - this will be set up by [facility] [place] [name], [name] ([id number]) In the areas of undermining I have recommended strips of drop attacks so as to help draw out the fluid from underneath day and then to apply a wound VAC as before. I have also recommended that the patient obtained a consultation with the plastic surgeons at [facility] where he is other care is being taken care of. Nutrition has been addressed and vitamin supplements including micronutrients have been discussed. Pressure offloading has been discussed in great detail especially the type of air mattress and the question for his wheelchair. Her toe was at the bedside is at all questions answered and will come back and see as an regular basis. Electronic Signature(s) Signed: 01/17/2015 2:57:05 PM By: Evlyn Kanner MD, FACS Previous Signature: 01/17/2015 2:56:16 PM Version By: Evlyn Kanner MD, FACS Entered By: Evlyn Kanner on 01/17/2015 14:57:05 Allmon, [name] [name] ([id number]) -------------------------------------------------------------------------------- ROS/PFSH Details Patient Name: [name], [name] Date of Service: 01/17/2015 1:00 PM Medical Record Number: [id number] Patient Account Number: [id number] Date of Birth/Sex: [birth year] (81 y.o. Male) Treating RN:  Curtis Sites Primary Care Physician: Audree Bane Other Clinician: Referring Physician: Audree Bane Treating Physician/Extender: Rudene Re in Treatment: 0 Information Obtained From Patient Caregiver Wound History Do you currently have one or more open woundso Yes How many open wounds do you currently haveo 1 Approximately how long have you had your woundso since March How have you been treating your wound(s) until nowo NPWT Has your wound(s) ever healed and then re-openedo No Have you had any lab work done in the past montho No Have you tested positive for an antibiotic resistant organism (MRSA, VRE)o No Have you tested positive for osteomyelitis (bone infection)o No Have you had any tests for circulation on your legso No Genitourinary Complaints and Symptoms: Positive for: Incontinence/dribbling Constitutional Symptoms (General Health) Complaints and Symptoms: No Complaints or Symptoms Eyes Medical History: Positive for: Cataracts; Glaucoma Ear/Nose/Mouth/Throat Complaints and Symptoms: No Complaints or Symptoms Hematologic/Lymphatic Complaints and Symptoms: No Complaints or Symptoms Medical History: Positive for: Anemia Respiratory [name], [name] ([id number]) Complaints and Symptoms: No Complaints  or Symptoms Cardiovascular Medical History: Positive for: Hypertension Gastrointestinal Complaints and Symptoms: No Complaints or Symptoms Medical History: Past Medical History Notes: dysphagia with g tube Endocrine Complaints and Symptoms: No Complaints or Symptoms Immunological Complaints and Symptoms: No Complaints or Symptoms Integumentary (Skin) Complaints and Symptoms: No Complaints or Symptoms Musculoskeletal Medical History: Positive for: Osteoarthritis Negative for: Rheumatoid Arthritis Neurologic Medical History: Past Medical History Notes: CVA with hemiplegia, hydrocephalus with shunt Oncologic Medical History: Negative for: Received  Chemotherapy; Received Radiation HBO Extended History Items Eyes: Eyes: Cataracts Glaucoma [name], [name] Hospitalization / Surgery History Name of Hospital Purpose of Hospitalization/Surgery Date [facility] in [place] CVA 08/28/2014 Family and Social History Cancer: Yes - Siblings; Diabetes: No; Heart Disease: No; Hereditary Spherocytosis: No; Hypertension: No; Kidney Disease: Yes - Siblings; Lung Disease: No; Seizures: No; Stroke: Yes - Siblings; Thyroid Problems: No; Tuberculosis: No; Former smoker - cigars when younger; Marital Status - Married; Alcohol Use: Never; Drug Use: No History; Caffeine Use: Never; Financial Concerns: No; Food, Clothing or Shelter Needs: No; Support System Lacking: No; Transportation Concerns: No; Advanced Directives: Yes (Not Provided); Patient does not want information on Advanced Directives; Medical Power of Attorney: Yes - Gayleen Orem (Not Provided) Physician Affirmation I have reviewed and agree with the above information. Electronic Signature(s) Signed: 01/17/2015 2:56:29 PM By: Evlyn Kanner MD, FACS Signed: 01/17/2015 4:43:54 PM By: Curtis Sites Entered By: Evlyn Kanner on 01/17/2015 14:56:29 Taul, Vergil ([id number]) -------------------------------------------------------------------------------- SuperBill Details Patient Name: Arnott, Bocephus Date of Service: 01/17/2015 Medical Record Number: [id number] Patient Account Number: [id number] Date of Birth/Sex: 09-10-1930 (81 y.o. Male) Treating RN: Primary Care Physician: Audree Bane Other Clinician: Referring Physician: Audree Bane Treating Physician/Extender: Rudene Re in Treatment: 0 Diagnosis Coding ICD-10 Codes Code Description L89.154 Pressure ulcer of sacral region, stage 4 L89.144 Pressure ulcer of left lower back, stage 4 G81.90 Hemiplegia, unspecified affecting unspecified side E44.0 Moderate protein-calorie malnutrition Facility Procedures CPT4 Code:  08657846 Description: 99214 - WOUND CARE VISIT-LEV 4 EST PT Modifier: Quantity: 1 Physician Procedures CPT4 Code: 9629528 Description: 99204 - WC PHYS LEVEL 4 - NEW PT ICD-10 Description Diagnosis L89.154 Pressure ulcer of sacral region, stage 4 L89.144 Pressure ulcer of left lower back, stage 4 G81.90 Hemiplegia, unspecified affecting unspecified sid E44.0 Moderate  protein-calorie malnutrition Modifier: e Quantity: 1 Electronic Signature(s) Signed: 01/17/2015 4:36:58 PM By: Curtis Sites Signed: 01/20/2015 12:34:21 PM By: Evlyn Kanner MD, FACS Previous Signature: 01/17/2015 2:57:24 PM Version By: Evlyn Kanner MD, FACS Entered By: Curtis Sites on 01/17/2015 16:36:58

## 2015-01-24 ENCOUNTER — Encounter: Payer: Medicare Other | Admitting: Surgery

## 2015-01-24 DIAGNOSIS — L89154 Pressure ulcer of sacral region, stage 4: Secondary | ICD-10-CM | POA: Diagnosis not present

## 2015-01-24 NOTE — Progress Notes (Addendum)
[name] [name], [name] [name] ([id number]) Visit Report for 01/24/2015 Chief Complaint Document Details Patient Name: [name] [name], [name] [name] Date of Service: 01/24/2015 11:45 AM Medical Record Number: [id number] Patient Account Number: [id number] Date of Birth/Sex: 1930/10/04 (81 y.o. Male) Treating RN: Primary Care Physician: Audree Bane Other Clinician: Referring Physician: Audree Bane Treating Physician/Extender: Rudene Re in Treatment: 1 Information Obtained from: Caregiver Chief Complaint Patient is at the clinic for treatment of an open pressure ulcer. The patient comes along with his daughter who is the caregiver and lives in an assisted living facility and has had a ulcerative area on the sacrum for the last 4 months. Electronic Signature(s) Signed: 01/24/2015 12:12:17 PM By: Evlyn Kanner MD, FACS Entered By: Evlyn Kanner on 01/24/2015 12:12:16 Moultrie, Kartel ([id number]) -------------------------------------------------------------------------------- HPI Details Patient Name: [name] [name], [name] [name] Date of Service: 01/24/2015 11:45 AM Medical Record Number: [id number] Patient Account Number: [id number] Date of Birth/Sex: 10-05-1930 (81 y.o. Male) Treating RN: Primary Care Physician: Audree Bane Other Clinician: Referring Physician: Audree Bane Treating Physician/Extender: Rudene Re in Treatment: 1 History of Present Illness Location: pressure injury to the sacral region and the left low back area. Quality: Patient reports experiencing a dull pain to affected area(s). Severity: Patient states wound are getting worse. Duration: Patient has had the wound for > 3 months prior to seeking treatment at the wound center Timing: Pain in wound is Intermittent (comes and goes Context: The wound appeared gradually over time Modifying Factors: Consults to this date include: wound VAC applications and local care Associated Signs and Symptoms: Wound has significant periowound erythema and localized  edema HPI Description: 81 year old gentleman who had a stroke in February of this year and has been bedbound since then. Since March he has developed a pressure injury and ulceration and has been treated at assisted living with a wound VAC and other supportive care. His had a PEG tube in place and though he eats some food during the day he gets supplementary PEG tube feeds between 6 PM and 6 AM. Has not had diabetes mellitus and was pretty active before his illness. 01/24/2015 -- they have not yet obtained a lasting surgery opinion at [facility]. The daughter has brought his supplies today so as we able to apply his wound VAC system. Electronic Signature(s) Signed: 01/24/2015 12:13:08 PM By: Evlyn Kanner MD, FACS Entered By: Evlyn Kanner on 01/24/2015 12:13:08 [name] [name], [name] [name] ([id number]) -------------------------------------------------------------------------------- Physical Exam Details Patient Name: [name] [name], [name] [name] Date of Service: 01/24/2015 11:45 AM Medical Record Number: [id number] Patient Account Number: [id number] Date of Birth/Sex: [birth year] (81 y.o. Male) Treating RN: Primary Care Physician: Audree Bane Other Clinician: Referring Physician: Audree Bane Treating Physician/Extender: Rudene Re in Treatment: 1 Constitutional . Pulse regular. Respirations normal and unlabored. Afebrile. . Eyes Nonicteric. Reactive to light. Ears, Nose, Mouth, and Throat Lips, teeth, and gums WNL.Marland Kitchen Moist mucosa without lesions . Neck supple and nontender. No palpable supraclavicular or cervical adenopathy. Normal sized without goiter. Respiratory WNL. No retractions.. Cardiovascular Pedal Pulses WNL. No clubbing, cyanosis or edema. Lymphatic No adneopathy. No adenopathy. No adenopathy. Musculoskeletal Adexa without tenderness or enlargement.. Digits and nails w/o clubbing, cyanosis, infection, petechiae, ischemia, or inflammatory conditions.. Integumentary (Hair, Skin) No suspicious  lesions. No crepitus or fluctuance. No peri-wound warmth or erythema. No masses.Marland Kitchen Psychiatric Judgement and insight Intact.. No evidence of depression, anxiety, or agitation.. Notes The undermining is there in the superior part of both ulcerations and ranges between 7-9 cm. Electronic Signature(s) Signed: 01/24/2015 12:14:37 PM By: Evlyn Kanner MD, FACS Previous Signature: 01/24/2015 12:14:10  PM Version By: Evlyn Kanner MD, FACS Entered By: Evlyn Kanner on 01/24/2015 12:14:37 [name] [name], [name] [name] ([id number]) -------------------------------------------------------------------------------- Physician Orders Details Patient Name: [name] [name], [name] [name] Date of Service: 01/24/2015 11:45 AM Medical Record Number: [id number] Patient Account Number: [id number] Date of Birth/Sex: [birth year] (81 y.o. Male) Treating RN: Clover Mealy, RN, BSN, [place] Primary Care Physician: Audree Bane Other Clinician: Referring Physician: Audree Bane Treating Physician/Extender: Rudene Re in Treatment: 1 Verbal / Phone Orders: Yes Clinician: Afful, RN, BSN, Rita Read Back and Verified: Yes Diagnosis Coding ICD-10 Coding Code Description L89.154 Pressure ulcer of sacral region, stage 4 L89.144 Pressure ulcer of left lower back, stage 4 G81.90 Hemiplegia, unspecified affecting unspecified side E44.0 Moderate protein-calorie malnutrition Wound Cleansing Wound #1 Medial Sacrum o Clean wound with Normal Saline. Anesthetic Wound #1 Medial Sacrum o Topical Lidocaine 4% cream applied to wound bed prior to debridement Skin Barriers/Peri-Wound Care Wound #1 Medial Sacrum o Skin Prep Primary Wound Dressing o Drawtex - strips of drawtex to undermining at the top of wound with green foam to wound bed with NPWT Dressing Change Frequency Wound #1 Medial Sacrum o Change Dressing Monday, Wednesday, Friday Follow-up Appointments Wound #1 Medial Sacrum o Return Appointment in 1 week. Additional Orders /  Instructions Wound #1 Medial Sacrum o Other: - please add zinc, selenium and vitamin c to patient's diet/medictions [name] [name], [name] [name] ([id number]) Negative Pressure Wound Therapy Wound #1 Medial Sacrum o Wound VAC settings at 125/130 mmHg continuous pressure. Use BLACK/GREEN foam to wound cavity. Use WHITE foam to fill any tunnel/s and/or undermining. Change VAC dressing 3 X WEEK. Change canister as indicated when full. Nurse may titrate settings and frequency of dressing changes as clinically indicated. o Home Health Nurse may d/c VAC for s/s of increased infection, significant wound regression, or uncontrolled drainage. Notify Wound Healing Center at [phone]. o Number of foam/gauze pieces used in the dressing = Electronic Signature(s) Signed: 01/24/2015 12:44:32 PM By: Elpidio Eric BSN, RN Signed: 01/24/2015 4:32:00 PM By: Evlyn Kanner MD, FACS Entered By: Elpidio Eric on 01/24/2015 12:44:32 [name] [name], [name] [name] ([id number]) -------------------------------------------------------------------------------- Problem List Details Patient Name: [name] [name], [name] [name] Date of Service: 01/24/2015 11:45 AM Medical Record Number: [id number] Patient Account Number: [id number] Date of Birth/Sex: July 05, 1931 (81 y.o. Male) Treating RN: Primary Care Physician: Audree Bane Other Clinician: Referring Physician: Audree Bane Treating Physician/Extender: Rudene Re in Treatment: 1 Active Problems ICD-10 Encounter Code Description Active Date Diagnosis L89.154 Pressure ulcer of sacral region, stage 4 01/17/2015 Yes L89.144 Pressure ulcer of left lower back, stage 4 01/17/2015 Yes G81.90 Hemiplegia, unspecified affecting unspecified side 01/17/2015 Yes E44.0 Moderate protein-calorie malnutrition 01/17/2015 Yes Inactive Problems Resolved Problems Electronic Signature(s) Signed: 01/24/2015 12:11:59 PM By: Evlyn Kanner MD, FACS Entered By: Evlyn Kanner on 01/24/2015 12:11:59 Pettey, Jacobi  ([id number]) -------------------------------------------------------------------------------- Progress Note Details Patient Name: [name] [name], [name] [name] Date of Service: 01/24/2015 11:45 AM Medical Record Number: [id number] Patient Account Number: [id number] Date of Birth/Sex: January 26, 1931 (81 y.o. Male) Treating RN: Primary Care Physician: Audree Bane Other Clinician: Referring Physician: Audree Bane Treating Physician/Extender: Rudene Re in Treatment: 1 Subjective Chief Complaint Information obtained from Caregiver Patient is at the clinic for treatment of an open pressure ulcer. The patient comes along with his daughter who is the caregiver and lives in an assisted living facility and has had a ulcerative area on the sacrum for the last 4 months. History of Present Illness (HPI) The following HPI elements were documented for the patient's wound: Location: pressure injury to the sacral region and the left low back  area. Quality: Patient reports experiencing a dull pain to affected area(s). Severity: Patient states wound are getting worse. Duration: Patient has had the wound for > 3 months prior to seeking treatment at the wound center Timing: Pain in wound is Intermittent (comes and goes Context: The wound appeared gradually over time Modifying Factors: Consults to this date include: wound VAC applications and local care Associated Signs and Symptoms: Wound has significant periowound erythema and localized edema 81 year old gentleman who had a stroke in February of this year and has been bedbound since then. Since March he has developed a pressure injury and ulceration and has been treated at assisted living with a wound VAC and other supportive care. His had a PEG tube in place and though he eats some food during the day he gets supplementary PEG tube feeds between 6 PM and 6 AM. Has not had diabetes mellitus and was pretty active before his illness. 01/24/2015 -- they have not yet  obtained a lasting surgery opinion at [facility]. The daughter has brought his supplies today so as we able to apply his wound VAC system. Objective Constitutional Pulse regular. Respirations normal and unlabored. Afebrile. Vitals Time Taken: 11:40 AM, Temperature: 98.3 F, Respiratory Rate: 18 breaths/min. [name] [name], [name] [name] ([id number]) Eyes Nonicteric. Reactive to light. Ears, Nose, Mouth, and Throat Lips, teeth, and gums WNL.Marland Kitchen Moist mucosa without lesions . Neck supple and nontender. No palpable supraclavicular or cervical adenopathy. Normal sized without goiter. Respiratory WNL. No retractions.. Cardiovascular Pedal Pulses WNL. No clubbing, cyanosis or edema. Lymphatic No adneopathy. No adenopathy. No adenopathy. Musculoskeletal Adexa without tenderness or enlargement.. Digits and nails w/o clubbing, cyanosis, infection, petechiae, ischemia, or inflammatory conditions.Marland Kitchen Psychiatric Judgement and insight Intact.. No evidence of depression, anxiety, or agitation.. General Notes: The undermining is there in the superior part of both ulcerations and ranges between 7-9 cm. Integumentary (Hair, Skin) No suspicious lesions. No crepitus or fluctuance. No peri-wound warmth or erythema. No masses.. Wound #1 status is Open. Original cause of wound was Gradually Appeared. The wound is located on the Medial Sacrum. The wound measures 14cm length x 8cm width x 2cm depth; 87.965cm^2 area and 175.929cm^3 volume. The wound is limited to skin breakdown. There is no tunneling noted, however, there is undermining starting at 1:00 and ending at 3:00 with a maximum distance of 9cm. There is a large amount of purulent drainage noted. The wound margin is flat and intact. There is medium (34-66%) red, pale granulation within the wound bed. There is a small (1-33%) amount of necrotic tissue within the wound bed including Adherent Slough. The periwound skin appearance exhibited: Scarring, Moist. The periwound skin  appearance did not exhibit: Callus, Crepitus, Excoriation, Fluctuance, Friable, Induration, Localized Edema, Rash, Dry/Scaly, Maceration, Atrophie Blanche, Cyanosis, Ecchymosis, Hemosiderin Staining, Mottled, Pallor, Rubor, Erythema. The periwound has tenderness on palpation. Assessment [name] [name], [name] [name] ([id number]) Active Problems ICD-10 L89.154 - Pressure ulcer of sacral region, stage 4 L89.144 - Pressure ulcer of left lower back, stage 4 G81.90 - Hemiplegia, unspecified affecting unspecified side E44.0 - Moderate protein-calorie malnutrition We will use strips of Drawtex under the undermined edges so as to draw the fluid towards the foam of the wound VAC. Wound VAC application to be changed by [place] on Friday and the home health nurses will change it to other times during the week. I have urged the daughter to get in touch with the plastic surgery group at [facility] so that a opinion could be caught about options for closure. He will come back and  see me next week. Plan Wound Cleansing: Wound #1 Medial Sacrum: Clean wound with Normal Saline. Anesthetic: Wound #1 Medial Sacrum: Topical Lidocaine 4% cream applied to wound bed prior to debridement Skin Barriers/Peri-Wound Care: Wound #1 Medial Sacrum: Skin Prep Primary Wound Dressing: Drawtex - strips of drawtex to undermining at the top of wound with green foam to wound bed with NPWT Dressing Change Frequency: Wound #1 Medial Sacrum: Change Dressing Monday, Wednesday, Friday Follow-up Appointments: Wound #1 Medial Sacrum: Return Appointment in 1 week. Additional Orders / Instructions: Wound #1 Medial Sacrum: Other: - please add zinc, selenium and vitamin c to patient's diet/medictions Negative Pressure Wound Therapy: Wound #1 Medial Sacrum: Wound VAC settings at 125/130 mmHg continuous pressure. Use BLACK/GREEN foam to wound cavity. Bozza, Korry ([id number]) Use WHITE foam to fill any tunnel/s and/or undermining. Change VAC dressing 3  X WEEK. Change canister as indicated when full. Nurse may titrate settings and frequency of dressing changes as clinically indicated. Home Health Nurse may d/c VAC for s/s of increased infection, significant wound regression, or uncontrolled drainage. Notify Wound Healing Center at [phone]. Number of foam/gauze pieces used in the dressing = We will use strips of Drawtex under the undermined edges so as to draw the fluid towards the foam of the wound VAC. Wound VAC application to be changed by us on Friday and the home health nurses will change it to other times during the week. I have urged the daughter to get in [facility] so that a opinion could be caught about options for closure. He will come back and see me next week. Electronic Signature(s) Signed: 01/24/2015 4:33:49 PM By: Evlyn KannerBritto, Cortlyn Cannell MD, FACS Previous Signature: 01/24/2015 12:16:28 PM Version By: Evlyn KannerBritto, Heston Widener MD, FACS Entered By: Evlyn KannerBritto, Carin Shipp on 01/24/2015 16:33:49 [name] [name], [name] [name] ([id number]) -------------------------------------------------------------------------------- SuperBill Details Patient Name: [name] [name], [name] [name] Date of Service: 01/24/2015 Medical Record Number: [id number] Patient Account Number: [id number] Date of Birth/Sex: 06/03/1931 [age] y.o. Male) Treating RN: Primary Care Physician: Audree BaneKING, PETER Other Clinician: Referring Physician: Audree BaneKING, PETER Treating Physician/Extender: Rudene ReBritto, Donyetta Ogletree Weeks in Treatment: 1 Diagnosis Coding ICD-10 Codes Code Description L89.154 Pressure ulcer of sacral region, stage 4 L89.144 Pressure ulcer of left lower back, stage 4 G81.90 Hemiplegia, unspecified affecting unspecified side E44.0 Moderate protein-calorie malnutrition Facility Procedures CPT4 Code: [id number] Description: 97607 NEG PRESS WND TX <=50 SQ CM Modifier: Quantity: 1 Physician Procedures CPT4 Code: [id number] Description: 99213 - WC PHYS LEVEL 3 - EST PT ICD-10 Description Diagnosis  L89.154 Pressure ulcer of sacral region, stage 4 L89.144 Pressure ulcer of left lower back, stage 4 G81.90 Hemiplegia, unspecified affecting unspecified sid Modifier: e Quantity: 1 Electronic Signature(s) Signed: 01/24/2015 5:59:39 PM By: Elliot GurneyWoody, RN, BSN, Kim RN, BSN Previous Signature: 01/24/2015 12:16:47 PM Version By: Evlyn KannerBritto, Fleur Audino MD, FACS Entered By: Elliot GurneyWoody, RN, BSN, Kim on 01/24/2015 17:30:08

## 2015-01-25 NOTE — Progress Notes (Signed)
[name] [name], [name] [name] ([id number]) Visit Report for 01/24/2015 Arrival Information Details Patient Name: [name] [name], [name] [name] Date of Service: 01/24/2015 11:45 AM Medical Record Number: [id number] Patient Account Number: [id number] Date of Birth/Sex: 03/05/1931 11(81 y.o. Male) Treating RN: Clover MealyAfful, RN, BSN, [place] Sinkita Primary Care Physician: Audree BaneKING, PETER Other Clinician: Referring Physician: Audree BaneKING, PETER Treating Physician/Extender: Rudene ReBritto, Errol Weeks in Treatment: 1 Visit Information History Since Last Visit Any new allergies or adverse reactions: No Patient Arrived: Wheel Chair Had a fall or experienced change in No activities of daily living that may affect Arrival Time: 11:41 risk of falls: Accompanied By: dtr Signs or symptoms of abuse/neglect since last No Transfer Assistance: None visito Patient Identification Verified: Yes Hospitalized since last visit: No Secondary Verification Process Yes Has Dressing in Place as Prescribed: Yes Completed: Pain Present Now: No Patient Has Alerts: Yes Electronic Signature(s) Signed: 01/24/2015 4:49:41 PM By: Elpidio EricAfful, Rita BSN, RN Entered By: Elpidio EricAfful, Rita on 01/24/2015 11:42:33 [name] [name], [name] [name] ([id number]) -------------------------------------------------------------------------------- Encounter Discharge Information Details Patient Name: [name] [name], [name] [name] Date of Service: 01/24/2015 11:45 AM Medical Record Number: [id number] Patient Account Number: [id number] Date of Birth/Sex: 08/25/1930 [age] y.o. Male) Treating RN: Clover MealyAfful, RN, BSN, [place] Sinkita Primary Care Physician: Audree BaneKING, PETER Other Clinician: Referring Physician: Audree BaneKING, PETER Treating Physician/Extender: Rudene ReBritto, Errol Weeks in Treatment: 1 Encounter Discharge Information Items Discharge Pain Level: 0 Discharge Condition: Stable Ambulatory Status: Wheelchair Nursing Discharge Destination: Home Transportation: Other Accompanied By: dtr Schedule Follow-up Appointment: No Medication Reconciliation completed No and  provided to Patient/Care Telsa Dillavou: Clinical Summary of Care: Electronic Signature(s) Signed: 01/24/2015 4:49:41 PM By: Elpidio EricAfful, Rita BSN, RN Entered By: Elpidio EricAfful, Rita on 01/24/2015 13:15:16 Schellhase, Kaveh ([id number]) -------------------------------------------------------------------------------- General Visit Notes Details Patient Name: [name] [name], [name] [name] Date of Service: 01/24/2015 11:45 AM Medical Record Number: [id number] Patient Account Number: [id number] Date of Birth/Sex: 01/19/1931 [age] y.o. Male) Treating RN: Clover MealyAfful, RN, BSN, [place] Sinkita Primary Care Physician: Audree BaneKING, PETER Other Clinician: Referring Physician: Audree BaneKING, PETER Treating Physician/Extender: Rudene ReBritto, Errol Weeks in Treatment: 1 Notes MD orders printed and sent with Patient in his returned envelope. Electronic Signature(s) Signed: 01/24/2015 4:49:41 PM By: Elpidio EricAfful, Rita BSN, RN Entered By: Elpidio EricAfful, Rita on 01/24/2015 13:13:32 [name] [name], [name] [name] ([id number]) -------------------------------------------------------------------------------- Lower Extremity Assessment Details Patient Name: [name] [name], [name] [name] Date of Service: 01/24/2015 11:45 AM Medical Record Number: [id number] Patient Account Number: [id number] Date of Birth/Sex: 10/30/1930 [age] y.o. Male) Treating RN: Clover MealyAfful, RN, BSN, [place] Sinkita Primary Care Physician: Audree BaneKING, PETER Other Clinician: Referring Physician: Audree BaneKING, PETER Treating Physician/Extender: Rudene ReBritto, Errol Weeks in Treatment: 1 Electronic Signature(s) Signed: 01/24/2015 4:49:41 PM By: Elpidio EricAfful, Rita BSN, RN Entered By: Elpidio EricAfful, Rita on 01/24/2015 11:44:37 [name] [name], [name] [name] ([id number]) -------------------------------------------------------------------------------- Multi Wound Chart Details Patient Name: [name] [name], [name] [name] Date of Service: 01/24/2015 11:45 AM Medical Record Number: [id number] Patient Account Number: [id number] Date of Birth/Sex: 08/19/1930 [age] y.o. Male) Treating RN: Clover MealyAfful, RN, BSN, [place] Sinkita Primary Care Physician: Audree BaneKING, PETER Other  Clinician: Referring Physician: Audree BaneKING, PETER Treating Physician/Extender: Rudene ReBritto, Errol Weeks in Treatment: 1 Vital Signs Height(in): Pulse(bpm): Weight(lbs): Blood Pressure (mmHg): Body Mass Index(BMI): Temperature(F): 98.3 Respiratory Rate 18 (breaths/min): Photos: [1:No Photos] [N/A:N/A] Wound Location: [1:Sacrum - Medial] [N/A:N/A] Wounding Event: [1:Gradually Appeared] [N/A:N/A] Primary Etiology: [1:Pressure Ulcer] [N/A:N/A] Comorbid History: [1:Cataracts, Glaucoma, Anemia, Hypertension, Osteoarthritis] [N/A:N/A] Date Acquired: [1:09/24/2014] [N/A:N/A] Weeks of Treatment: [1:1] [N/A:N/A] Wound Status: [1:Open] [N/A:N/A] Measurements L x W x D 14x8x2 [N/A:N/A] (cm) Area (cm) : [1:87.965] [N/A:N/A] Volume (cm) : [1:175.929] [N/A:N/A] % Reduction in Area: [1:4.90%] [N/A:N/A] % Reduction in Volume: -46.30% [N/A:N/A] Starting Position 1 1 (o'clock): Ending Position 1 [1:3] (o'clock): Maximum  Distance 1 9 (cm): Undermining: [1:Yes] [N/A:N/A] Classification: [1:Category/Stage III] [N/A:N/A] Exudate Amount: [1:Large] [N/A:N/A] Exudate Type: [1:Purulent] [N/A:N/A] Exudate Color: [1:yellow, brown, green] [N/A:N/A] Foul Odor After [1:Yes] [N/A:N/A] Cleansing: [1:No] [N/A:N/A] Odor Anticipated Due to Product Use: Wound Margin: Flat and Intact N/A N/A Granulation Amount: Medium (34-66%) N/A N/A Granulation Quality: Red, Pale N/A N/A Necrotic Amount: Small (1-33%) N/A N/A Exposed Structures: Fascia: No N/A N/A Fat: No Tendon: No Muscle: No Joint: No Bone: No Limited to Skin Breakdown Epithelialization: None N/A N/A Periwound Skin Texture: Scarring: Yes N/A N/A Edema: No Excoriation: No Induration: No Callus: No Crepitus: No Fluctuance: No Friable: No Rash: No Periwound Skin Moist: Yes N/A N/A Moisture: Maceration: No Dry/Scaly: No Periwound Skin Color: Atrophie Blanche: No N/A N/A Cyanosis: No Ecchymosis: No Erythema: No Hemosiderin Staining:  No Mottled: No Pallor: No Rubor: No Tenderness on Yes N/A N/A Palpation: Wound Preparation: Ulcer Cleansing: N/A N/A Rinsed/Irrigated with Saline Topical Anesthetic Applied: Other: lidocaine 4% Treatment Notes Electronic Signature(s) Signed: 01/24/2015 4:49:41 PM By: Elpidio Eric BSN, RN Prak, Johnel ([id number]) Entered By: Elpidio Eric on 01/24/2015 12:07:11 Ryant, Crosley ([id number]) -------------------------------------------------------------------------------- Multi-Disciplinary Care Plan Details Patient Name: [name] [name], [name] [name] Date of Service: 01/24/2015 11:45 AM Medical Record Number: [id number] Patient Account Number: [id number] Date of Birth/Sex: 01-Mar-1931 (81 y.o. Male) Treating RN: Clover Mealy, RN, BSN, [place] Sink Primary Care Physician: Audree Bane Other Clinician: Referring Physician: Audree Bane Treating Physician/Extender: Rudene Re in Treatment: 1 Active Inactive Orientation to the Wound Care Program Nursing Diagnoses: Knowledge deficit related to the wound healing center program Goals: Patient/caregiver will verbalize understanding of the Wound Healing Center Program Date Initiated: 01/17/2015 Goal Status: Active Interventions: Provide education on orientation to the wound center Notes: Pressure Nursing Diagnoses: Potential for impaired tissue integrity related to pressure, friction, moisture, and shear Goals: Patient will remain free from development of additional pressure ulcers Date Initiated: 01/17/2015 Goal Status: Active Interventions: Provide education on pressure ulcers Notes: Wound/Skin Impairment Nursing Diagnoses: Impaired tissue integrity Goals: Ulcer/skin breakdown will have a volume reduction of 30% by week 4 Date Initiated: 01/17/2015 [name] [name], [name] [name] ([id number]) Goal Status: Active Interventions: Assess ulceration(s) every visit Notes: Electronic Signature(s) Signed: 01/24/2015 4:49:41 PM By: Elpidio Eric BSN, RN Entered By: Elpidio Eric on  01/24/2015 12:06:55 [name] [name], [name] [name] ([id number]) -------------------------------------------------------------------------------- Pain Assessment Details Patient Name: [name] [name], [name] [name] Date of Service: 01/24/2015 11:45 AM Medical Record Number: [id number] Patient Account Number: [id number] Date of Birth/Sex: Feb 14, 1931 (81 y.o. Male) Treating RN: Clover Mealy, RN, BSN, Rita Primary Care Physician: Audree Bane Other Clinician: Referring Physician: Audree Bane Treating Physician/Extender: Rudene Re in Treatment: 1 Active Problems Location of Pain Severity and Description of Pain Patient Has Paino No Site Locations Pain Management and Medication Current Pain Management: Electronic Signature(s) Signed: 01/24/2015 4:49:41 PM By: Elpidio Eric BSN, RN Entered By: Elpidio Eric on 01/24/2015 11:42:46 [name] [name], [name] [name] ([id number]) -------------------------------------------------------------------------------- Patient/Caregiver Education Details Patient Name: [name] [name], [name] [name] Date of Service: 01/24/2015 11:45 AM Medical Record Number: [id number] Patient Account Number: [id number] Date of Birth/Gender: 08-14-1930 (81 y.o. Male) Treating RN: Clover Mealy, RN, BSN,  Sink Primary Care Physician: Audree Bane Other Clinician: Referring Physician: Audree Bane Treating Physician/Extender: Rudene Re in Treatment: 1 Education Assessment Education Provided To: Caregiver SNF, daugther Education Topics Provided Pressure: Responses: Reinforcements needed Welcome To The Wound Care Center: Methods: Explain/Verbal Responses: State content correctly Electronic Signature(s) Signed: 01/24/2015 4:49:41 PM By: Elpidio Eric BSN, RN Entered By: Elpidio Eric on 01/24/2015 13:15:49 Castagna, Elbridge ([id number]) -------------------------------------------------------------------------------- Wound Assessment Details Patient Name: Hinzman, Michaeljames Date of  Service: 01/24/2015 11:45 AM Medical Record Number: [id number] Patient  Account Number: [id number] Date of Birth/Sex: 12-14-1930 (81 y.o. Male) Treating RN: Clover Mealy, RN, BSN, Rita Primary Care Physician: Audree Bane Other Clinician: Referring Physician: Audree Bane Treating Physician/Extender: Rudene Re in Treatment: 1 Wound Status Wound Number: 1 Primary Pressure Ulcer Etiology: Wound Location: Sacrum - Medial Wound Open Wounding Event: Gradually Appeared Status: Date Acquired: 09/24/2014 Comorbid Cataracts, Glaucoma, Anemia, Weeks Of Treatment: 1 History: Hypertension, Osteoarthritis Clustered Wound: No Photos Photo Uploaded By: Elpidio Eric on 01/24/2015 16:41:31 Wound Measurements Length: (cm) 14 Width: (cm) 8 Depth: (cm) 2 Area: (cm) 87.965 Volume: (cm) 175.929 % Reduction in Area: 4.9% % Reduction in Volume: -46.3% Epithelialization: None Tunneling: No Undermining: Yes Starting Position (o'clock): 1 Ending Position (o'clock): 3 Maximum Distance: (cm) 9 Wound Description Classification: Category/Stage III Wound Margin: Flat and Intact Exudate Amount: Large Exudate Type: Purulent Exudate Color: yellow, brown, green Foul Odor After Cleansing: Yes Due to Product Use: No Wound Bed Vollman, Lathen ([id number]) Granulation Amount: Medium (34-66%) Exposed Structure Granulation Quality: Red, Pale Fascia Exposed: No Necrotic Amount: Small (1-33%) Fat Layer Exposed: No Necrotic Quality: Adherent Slough Tendon Exposed: No Muscle Exposed: No Joint Exposed: No Bone Exposed: No Limited to Skin Breakdown Periwound Skin Texture Texture Color No Abnormalities Noted: No No Abnormalities Noted: No Callus: No Atrophie Blanche: No Crepitus: No Cyanosis: No Excoriation: No Ecchymosis: No Fluctuance: No Erythema: No Friable: No Hemosiderin Staining: No Induration: No Mottled: No Localized Edema: No Pallor: No Rash: No Rubor: No Scarring: Yes Temperature / Pain Moisture Tenderness on Palpation: Yes No Abnormalities Noted:  No Dry / Scaly: No Maceration: No Moist: Yes Wound Preparation Ulcer Cleansing: Rinsed/Irrigated with Saline Topical Anesthetic Applied: Other: lidocaine 4%, Treatment Notes Wound #1 (Medial Sacrum) 1. Cleansed with: Clean wound with Normal Saline 3. Peri-wound Care: Skin Prep 8. Negative Pressure Wound Therapy Wound Vac to wound continuously at 129mm/hg pressure Number of foam/gauze pieces used in the dressing = Change canister as needed. Notes drawtex two green foam to wound one green one use as a bridged. Wiswell, Hezzie ([id number]) Electronic Signature(s) Signed: 01/24/2015 4:49:41 PM By: Elpidio Eric BSN, RN Entered By: Elpidio Eric on 01/24/2015 12:00:51 Mcpartlin, Areon ([id number]) -------------------------------------------------------------------------------- Vitals Details Patient Name: Slavens, Roxie Date of Service: 01/24/2015 11:45 AM Medical Record Number: [id number] Patient Account Number: [id number] Date of Birth/Sex: [birth year] (81 y.o. Male) Treating RN: Clover Mealy, RN, BSN, [place] Sink Primary Care Physician: Audree Bane Other Clinician: Referring Physician: Audree Bane Treating Physician/Extender: Rudene Re in Treatment: 1 Vital Signs Time Taken: 11:40 Temperature (F): 98.3 Respiratory Rate (breaths/min): 18 Reference Range: 80 - 120 mg / dl Electronic Signature(s) Signed: 01/24/2015 4:49:41 PM By: Elpidio Eric BSN, RN Entered By: Elpidio Eric on 01/24/2015 11:44:31

## 2015-01-31 ENCOUNTER — Ambulatory Visit: Payer: Medicare Other | Admitting: Surgery

## 2015-03-08 ENCOUNTER — Other Ambulatory Visit
Admission: RE | Admit: 2015-03-08 | Discharge: 2015-03-08 | Disposition: A | Discharge status: Home / Self Care | Payer: Medicare Other | Source: Ambulatory Visit | Attending: Family Medicine | Admitting: Family Medicine

## 2015-03-08 DIAGNOSIS — R05 Cough: Secondary | ICD-10-CM | POA: Diagnosis present

## 2015-03-08 DIAGNOSIS — M6281 Muscle weakness (generalized): Secondary | ICD-10-CM | POA: Diagnosis not present

## 2015-03-08 LAB — MAGNESIUM: Magnesium: 2 mg/dL (ref 1.7–2.4)

## 2015-03-08 LAB — URINALYSIS COMPLETE WITH MICROSCOPIC (ARMC ONLY)
BACTERIA UA: NONE SEEN
Bilirubin Urine: NEGATIVE
GLUCOSE, UA: NEGATIVE mg/dL
Hgb urine dipstick: NEGATIVE
KETONES UR: NEGATIVE mg/dL
Leukocytes, UA: NEGATIVE
NITRITE: NEGATIVE
PROTEIN: NEGATIVE mg/dL
SPECIFIC GRAVITY, URINE: 1.018 (ref 1.005–1.030)
pH: 6 (ref 5.0–8.0)

## 2015-03-08 LAB — CBC WITH DIFFERENTIAL/PLATELET
BASOS ABS: 0.2 10*3/uL — AB (ref 0–0.1)
BASOS PCT: 1 %
EOS ABS: 0 10*3/uL (ref 0–0.7)
EOS PCT: 0 %
HCT: 27.4 % — ABNORMAL LOW (ref 40.0–52.0)
Hemoglobin: 8.1 g/dL — ABNORMAL LOW (ref 13.0–18.0)
Lymphocytes Relative: 3 %
Lymphs Abs: 0.7 10*3/uL — ABNORMAL LOW (ref 1.0–3.6)
MCH: 19.8 pg — ABNORMAL LOW (ref 26.0–34.0)
MCHC: 29.7 g/dL — ABNORMAL LOW (ref 32.0–36.0)
MCV: 66.6 fL — ABNORMAL LOW (ref 80.0–100.0)
Monocytes Absolute: 1.5 10*3/uL — ABNORMAL HIGH (ref 0.2–1.0)
Monocytes Relative: 6 %
Neutro Abs: 21.7 10*3/uL — ABNORMAL HIGH (ref 1.4–6.5)
Neutrophils Relative %: 90 %
PLATELETS: 297 10*3/uL (ref 150–440)
RBC: 4.11 MIL/uL — AB (ref 4.40–5.90)
RDW: 19.4 % — AB (ref 11.5–14.5)
WBC: 24.1 10*3/uL — AB (ref 3.8–10.6)

## 2015-03-08 LAB — BASIC METABOLIC PANEL
ANION GAP: 12 (ref 5–15)
BUN: 20 mg/dL (ref 6–20)
CALCIUM: 7.8 mg/dL — AB (ref 8.9–10.3)
CO2: 21 mmol/L — ABNORMAL LOW (ref 22–32)
Chloride: 101 mmol/L (ref 101–111)
Creatinine, Ser: 0.5 mg/dL — ABNORMAL LOW (ref 0.61–1.24)
GFR calc Af Amer: >60 mL/min (ref >60)
Glucose, Bld: 73 mg/dL (ref 65–99)
POTASSIUM: 4.2 mmol/L (ref 3.5–5.1)
SODIUM: 134 mmol/L — AB (ref 135–145)

## 2015-03-08 LAB — PHOSPHORUS: PHOSPHORUS: 3.1 mg/dL (ref 2.5–4.6)

## 2015-03-20 ENCOUNTER — Encounter: Payer: Self-pay | Admitting: *Deleted

## 2015-03-20 ENCOUNTER — Emergency Department
Admission: EM | Admit: 2015-03-20 | Discharge: 2015-03-21 | Disposition: A | Discharge status: Home / Self Care | Payer: Medicare Other | Attending: Emergency Medicine | Admitting: Emergency Medicine

## 2015-03-20 ENCOUNTER — Emergency Department: Payer: Medicare Other

## 2015-03-20 DIAGNOSIS — L89159 Pressure ulcer of sacral region, unspecified stage: Secondary | ICD-10-CM | POA: Insufficient documentation

## 2015-03-20 DIAGNOSIS — R05 Cough: Secondary | ICD-10-CM | POA: Insufficient documentation

## 2015-03-20 DIAGNOSIS — R4182 Altered mental status, unspecified: Secondary | ICD-10-CM | POA: Diagnosis present

## 2015-03-20 DIAGNOSIS — R059 Cough, unspecified: Secondary | ICD-10-CM

## 2015-03-20 HISTORY — DX: Cerebral infarction, unspecified: I63.9

## 2015-03-20 HISTORY — DX: Essential (primary) hypertension: I10

## 2015-03-20 LAB — COMPREHENSIVE METABOLIC PANEL
ALT: 19 U/L (ref 17–63)
AST: 23 U/L (ref 15–41)
Albumin: 2.6 g/dL — ABNORMAL LOW (ref 3.5–5.0)
Alkaline Phosphatase: 80 U/L (ref 38–126)
Anion gap: 8 (ref 5–15)
BILIRUBIN TOTAL: 0.4 mg/dL (ref 0.3–1.2)
BUN: 19 mg/dL (ref 6–20)
CALCIUM: 8.3 mg/dL — AB (ref 8.9–10.3)
CO2: 24 mmol/L (ref 22–32)
Chloride: 108 mmol/L (ref 101–111)
Creatinine, Ser: 0.35 mg/dL — ABNORMAL LOW (ref 0.61–1.24)
GFR calc Af Amer: >60 mL/min (ref >60)
Glucose, Bld: 85 mg/dL (ref 65–99)
POTASSIUM: 4 mmol/L (ref 3.5–5.1)
Sodium: 140 mmol/L (ref 135–145)
TOTAL PROTEIN: 6.4 g/dL — AB (ref 6.5–8.1)

## 2015-03-20 LAB — TROPONIN I

## 2015-03-20 LAB — URINALYSIS COMPLETE WITH MICROSCOPIC (ARMC ONLY)
BILIRUBIN URINE: NEGATIVE
Bacteria, UA: NONE SEEN
Glucose, UA: NEGATIVE mg/dL
HGB URINE DIPSTICK: NEGATIVE
KETONES UR: NEGATIVE mg/dL
NITRITE: NEGATIVE
PH: 7 (ref 5.0–8.0)
Protein, ur: NEGATIVE mg/dL
RBC / HPF: NONE SEEN RBC/hpf (ref 0–5)
Specific Gravity, Urine: 1.012 (ref 1.005–1.030)

## 2015-03-20 LAB — CBC WITH DIFFERENTIAL/PLATELET
BASOS ABS: 0 10*3/uL (ref 0–0.1)
BASOS PCT: 1 %
EOS ABS: 0 10*3/uL (ref 0–0.7)
EOS PCT: 0 %
HEMATOCRIT: 30.2 % — AB (ref 40.0–52.0)
Hemoglobin: 9.1 g/dL — ABNORMAL LOW (ref 13.0–18.0)
Lymphocytes Relative: 12 %
Lymphs Abs: 1 10*3/uL (ref 1.0–3.6)
MCH: 20.7 pg — ABNORMAL LOW (ref 26.0–34.0)
MCHC: 30.2 g/dL — ABNORMAL LOW (ref 32.0–36.0)
MCV: 68.5 fL — ABNORMAL LOW (ref 80.0–100.0)
MONO ABS: 1.5 10*3/uL — AB (ref 0.2–1.0)
Monocytes Relative: 17 %
NEUTROS ABS: 6.3 10*3/uL (ref 1.4–6.5)
Neutrophils Relative %: 70 %
PLATELETS: 428 10*3/uL (ref 150–440)
RBC: 4.41 MIL/uL (ref 4.40–5.90)
RDW: 20.8 % — AB (ref 11.5–14.5)
WBC: 8.9 10*3/uL (ref 3.8–10.6)

## 2015-03-20 MED ORDER — GUAIFENESIN 100 MG/5ML PO SOLN
10.0000 mL | Freq: Once | ORAL | Status: AC
  Administered 2015-03-20: 200 mg via ORAL
  Filled 2015-03-20: qty 10

## 2015-03-20 MED ORDER — GUAIFENESIN 200 MG PO TABS: 200.0000 mg | ORAL_TABLET | ORAL | Status: DC | PRN

## 2015-03-20 MED ORDER — SODIUM CHLORIDE 0.9 % IV SOLN
Freq: Once | INTRAVENOUS | Status: AC
  Administered 2015-03-20: 20:00:00 via INTRAVENOUS

## 2015-03-20 MED ORDER — ACETAMINOPHEN 160 MG/5ML PO SOLN
1000.0000 mg | Freq: Once | ORAL | Status: AC
  Administered 2015-03-20: 1000 mg via ORAL

## 2015-03-20 MED ORDER — SODIUM CHLORIDE 0.9 % IV BOLUS (SEPSIS): 1000.0000 mL | Freq: Once | INTRAVENOUS | Status: DC

## 2015-03-20 MED ORDER — ACETAMINOPHEN 160 MG/5ML PO SUSP
ORAL | Status: AC
  Filled 2015-03-20: qty 35

## 2015-03-20 NOTE — ED Notes (Signed)
Pt straight cathed at this time with no success. Pt bladder scan following with results of 250 consistently. MD Paduchowski made aware and orders for NS given at this time. Will attempt for urine again following administration of some fluids. Pt and family made aware, verbalized understanding at this time

## 2015-03-20 NOTE — ED Notes (Signed)
Condom cath and leg bag attached to pt at this time in order to obtain urine sample.

## 2015-03-20 NOTE — ED Provider Notes (Signed)
[facility] Emergency Department Provider Note  Time seen: 4:07 PM  I have reviewed the triage vital signs and the nursing notes.   HISTORY  Chief Complaint Altered Mental Status    HPI [name] [name] is a 81 y.o. male with a past medical history of CVA, hypertension who presents the emergency department weren't increased confusion over the patient's baseline. According to the patient's family per report the patient has been increasingly confused for the past several days. The patient is currently undergoing anabiotic therapy for a diagnosed pneumonia. Patient also has a large sacral ulcer currently being treated by wound management with a wound vacuum. Per EMS report the nursing home staff states patient is at his baseline mental status per them, they have not noted an increased amount of confusion, but the family requested an evaluation as they believe the patient is more confused. Patient has a history of CVA, denies any complaints currently, states he feels well.     No past medical history on file.  There are no active problems to display for this patient.   No past surgical history on file.  No current outpatient prescriptions on file.  Allergies Review of patient's allergies indicates not on file.  No family history on file.  Social History Social History  Substance Use Topics  . Smoking status: Not on file  . Smokeless tobacco: Not on file  . Alcohol Use: Not on file    Review of Systems Constitutional: Negative for fever Cardiovascular: Negative for chest pain. Respiratory: Negative for shortness of breath. Positive for cough. Gastrointestinal: Negative for abdominal pain Genitourinary: Negative for dysuria. Neurological: Negative for headache 10-point ROS otherwise negative.  ____________________________________________   PHYSICAL EXAM:  Constitutional: Alert and oriented. Well appearing and in no distress. Eyes: Normal  exam ENT   Mouth/Throat: Mucous membranes are moist. Cardiovascular: Normal rate, regular rhythm. No murmur Respiratory: Normal respiratory effort without tachypnea nor retractions. Breath sounds are clear and equal bilaterally. No wheezes/rales/rhonchi. Gastrointestinal: Soft and nontender. No distention.  G-tube present. Musculoskeletal: No lower extremity tenderness or edema. Patient does have a sacral ulcer, with a wound VAC dressing applied currently. Neurologic:  Normal speech and language. Patient able to communicate adequately, follows commands appropriately. Skin:  Skin is warm, dry and intact.  Psychiatric: Mood and affect are normal.  ____________________________________________     RADIOLOGY  Chest x-ray shows no acute abnormality.  ____________________________________________    INITIAL IMPRESSION / ASSESSMENT AND PLAN / ED COURSE  Pertinent labs & imaging results that were available during my care of the patient were reviewed by me and considered in my medical decision making (see chart for details).  Patient with possible altered mental status per family, per EMS report. We will check labs, blood cultures, urinalysis, chest x-ray to help further evaluate. Per report patient is currently being treated or pneumonia, but is unclear which antibiotics the patient is currently on. Chest x-ray shows no acute abnormality. Labs are largely within normal limits including urinalysis. Patient's heart rate has been varying between 90 and 110 bpm. At this time I do not see a reason necessitating admission. The daughter is here who is agreeable to discharge home with follow-up at the wound care clinic to evaluate his sacral ulcer, as he daughter does not believe the nursing facility is caring for this adequately.   ____________________________________________   FINAL CLINICAL IMPRESSION(S) / ED DIAGNOSES  Cough Sacral ulcer   Minna Antis, MD 03/20/15 2240

## 2015-03-20 NOTE — ED Notes (Signed)
Pt soiled bed/breif at this time. Pt cleaned, sheets changed, new brief given at this time. Pt tolerated well, no acute distress noted.

## 2015-03-20 NOTE — ED Notes (Signed)
Pt to ED via EMS from [facility] due to increased altered mental status per pt's family. Pt baseline confusion per staff at facility. Pt AAOx3 on arrival, MD Paduchowski at bedside. Pt currently being treated with antibiotics for pneumonia, has current bed sore on saccral area which is attached to wound vac. Wound vac d/c by staff at facility prior to coming to ED.

## 2015-03-20 NOTE — ED Notes (Signed)
Leg bag empty at this time, no urine produced. MD verbal order for straight cath attempt again

## 2015-03-20 NOTE — ED Notes (Signed)
EMS called for transport.

## 2015-03-20 NOTE — Discharge Instructions (Signed)
Please continue your prescribed anabiotic fortes entire duration, as prescribed previously. Please call the number provided for wound care clinic regarding your sacral ulcer. Please return to the emergency department for any fever, worsening cough, altered mental status, or any other symptom personally concerning to you yourself or staff.   Cough, Adult  A cough is a reflex that helps clear your throat and airways. It can help heal the body or may be a reaction to an irritated airway. A cough may only last 2 or 3 weeks (acute) or may last more than 8 weeks (chronic).  CAUSES Acute cough:  Viral or bacterial infections. Chronic cough:  Infections.  Allergies.  Asthma.  Post-nasal drip.  Smoking.  Heartburn or acid reflux.  Some medicines.  Chronic lung problems (COPD).  Cancer. SYMPTOMS   Cough.  Fever.  Chest pain.  Increased breathing rate.  High-pitched whistling sound when breathing (wheezing).  Colored mucus that you cough up (sputum). TREATMENT   A bacterial cough may be treated with antibiotic medicine.  A viral cough must run its course and will not respond to antibiotics.  Your caregiver may recommend other treatments if you have a chronic cough. HOME CARE INSTRUCTIONS   Only take over-the-counter or prescription medicines for pain, discomfort, or fever as directed by your caregiver. Use cough suppressants only as directed by your caregiver.  Use a cold steam vaporizer or humidifier in your bedroom or home to help loosen secretions.  Sleep in a semi-upright position if your cough is worse at night.  Rest as needed.  Stop smoking if you smoke. SEEK IMMEDIATE MEDICAL CARE IF:   You have pus in your sputum.  Your cough starts to worsen.  You cannot control your cough with suppressants and are losing sleep.  You begin coughing up blood.  You have difficulty breathing.  You develop pain which is getting worse or is uncontrolled with  medicine.  You have a fever. MAKE SURE YOU:   Understand these instructions.  Will watch your condition.  Will get help right away if you are not doing well or get worse. Document Released: 12/25/2010 Document Revised: 09/20/2011 Document Reviewed: 12/25/2010 [facility] Patient Information 2015 [place], [place]. This information is not intended to replace advice given to you by your health care provider. Make sure you discuss any questions you have with your health care provider.  Pressure Ulcer A pressure ulcer is a sore that has formed from the breakdown of skin and exposure of deeper layers of tissue. It develops in areas of the body where there is unrelieved pressure. Pressure ulcers are usually found over a bony area, such as the shoulder blades, spine, lower back, hips, knees, ankles, and heels. Pressure ulcers vary in severity. Your health care provider may determine the severity (stage) of your pressure ulcer. The stages include:  Stage I--The skin is red, and when the skin is pressed, it stays red.  Stage II--The top layer of skin is gone, and there is a shallow, pink ulcer.  Stage III--The ulcer becomes deeper, and it is more difficult to see the whole wound. Also, there may be yellow or brown parts, as well as pink and red parts.  Stage IV--The ulcer may be deep and red, pink, brown, white, or yellow. Bone or muscle may be seen.  Unstageable pressure ulcer--The ulcer is covered almost completely with black, brown, or yellow tissue. It is not known how deep the ulcer is or what stage it is until this covering comes  off.  Suspected deep tissue injury--A person's skin can be injured from pressure or pulling on the skin when his or her position is changed. The skin appears purple or maroon. There may not be an opening in the skin, but there could be a blood-filled blister. This deep tissue injury is often difficult to see in people with darker skin tones. The site may open and become  deeper in time. However, early interventions will help the area heal and may prevent the area from opening. CAUSES  Pressure ulcers are caused by pressure against the skin that limits the flow of blood to the skin and nearby tissues. There are many risk factors that can lead to pressure sores. RISK FACTORS  Decreased ability to move.  Decreased ability to feel pain or discomfort.  Excessive skin moisture from urine, stool, sweat, or secretions.  Poor nutrition.  Dehydration.  Tobacco, drug, or alcohol abuse.  Having someone pull on bedsheets that are under you, such as when health care workers are changing your position in a hospital bed.  Obesity.  Increased adult age.  Hospitalization in a critical care unit for longer than 4 days with use of medical devices.  Prolonged use of medical devices.  Critical illness.  Anemia.  Traumatic brain injury.  Spinal cord injury.  Stroke.  Diabetes.  Poor blood glucose control.  Low blood pressure (hypotension).  Low oxygen levels.  Medicines that reduce blood flow.  Infection. DIAGNOSIS  Your health care provider will diagnose your pressure ulcer based on its appearance. The health care provider may determine the stage of your pressure ulcer as well. Tests may be done to check for infection, to assess your circulation, or to check for other diseases, such as diabetes. TREATMENT  Treatment of your pressure ulcer begins with determining what stage the ulcer is in. Your treatment team may include your health care provider, a wound care specialist, a nutritionist, a physical therapist, and a Careers adviser. Possible treatments may include:   Moving or repositioning every 1-2 hours.  Using beds or mattresses to shift your body weight and pressure points frequently.  Improving your diet.  Cleaning and bandaging (dressing) the open wound.  Giving antibiotic medicines.  Removing damaged tissue.  Surgery and sometimes skin  grafts. HOME CARE INSTRUCTIONS  If you were hospitalized, follow the care plan that was started in the hospital.  Avoid staying in the same position for more than 2 hours. Use padding, devices, or mattresses to cushion your pressure points as directed by your health care provider.  Eat a well-balanced diet. Take nutritional supplements and vitamins as directed by your health care provider.  Keep all follow-up appointments.  Only take over-the-counter or prescription medicines for pain, fever, or discomfort as directed by your health care provider. SEEK MEDICAL CARE IF:   Your pressure ulcer is not improving.  You do not know how to care for your pressure ulcer.  You notice other areas of redness on your skin.  You have a fever. SEEK IMMEDIATE MEDICAL CARE IF:   You have increasing redness, swelling, or pain in your pressure ulcer.  You notice pus coming from your pressure ulcer.  You notice a bad smell coming from the wound or dressing.  Your pressure ulcer opens up again. Document Released: 06/28/2005 Document Revised: 07/03/2013 Document Reviewed: 03/05/2013 [facility] Patient Information 2015 [place], [place]. This information is not intended to replace advice given to you by your health care provider. Make sure you discuss any questions you  have with your health care provider. ° °

## 2015-03-20 NOTE — ED Notes (Signed)
Pharmacy called regarding Guiafenesin solution, will send to ED.

## 2015-03-22 LAB — URINE CULTURE

## 2015-04-22 ENCOUNTER — Encounter: Payer: Medicare Other | Attending: Surgery | Admitting: Surgery

## 2015-04-22 DIAGNOSIS — G8190 Hemiplegia, unspecified affecting unspecified side: Secondary | ICD-10-CM | POA: Diagnosis not present

## 2015-04-22 DIAGNOSIS — E441 Mild protein-calorie malnutrition: Secondary | ICD-10-CM | POA: Insufficient documentation

## 2015-04-22 DIAGNOSIS — L89153 Pressure ulcer of sacral region, stage 3: Secondary | ICD-10-CM | POA: Diagnosis present

## 2015-05-06 ENCOUNTER — Encounter: Payer: Medicare Other | Admitting: Surgery

## 2015-05-06 DIAGNOSIS — L89153 Pressure ulcer of sacral region, stage 3: Secondary | ICD-10-CM | POA: Diagnosis not present

## 2015-05-06 NOTE — Progress Notes (Addendum)
Spath, Alic ([id number]) Visit Report for 05/06/2015 Chief Complaint Document Details Patient Name: [name] [name], [name] [name] Date of Service: 05/06/2015 2:15 PM Medical Record Number: [id number] Patient Account Number: [id number] Date of Birth/Sex: 03/02/1931 [age] y.o. Male) Treating RN: Huel CoventryWoody, Kim Primary Care Physician: SYSTEM, PCP Other Clinician: Referring Physician: Audree BaneKING, PETER Treating Physician/Extender: Rudene ReBritto, Antoine Vandermeulen Weeks in Treatment: 2 Information Obtained from: Caregiver Chief Complaint Patient is at the clinic for treatment of an open pressure ulcer. The patient comes along with his daughter who is the caregiver and lives in an assisted living facility and has had a ulcerative area on the sacrum for the last 6 months. Electronic Signature(s) Signed: 05/06/2015 2:26:19 PM By: Evlyn KannerBritto, Dorothee Napierkowski MD, FACS Entered By: Evlyn KannerBritto, Yuniel Blaney on 05/06/2015 14:26:19 Herro, Luigi ([id number]) -------------------------------------------------------------------------------- HPI Details Patient Name: [name] [name] Date of Service: 05/06/2015 2:15 PM Medical Record Number: [id number] Patient Account Number: [id number] Date of Birth/Sex: 07/25/1930 [age] y.o. Male) Treating RN: Huel CoventryWoody, Kim Primary Care Physician: SYSTEM, PCP Other Clinician: Referring Physician: Audree BaneKING, PETER Treating Physician/Extender: Rudene ReBritto, Anice Wilshire Weeks in Treatment: 2 History of Present Illness Location: pressure injury to the sacral region and the left low back area. Quality: Patient reports experiencing a dull pain to affected area(s). Severity: Patient states wound are getting worse. Duration: Patient has had the wound for > 3 months prior to seeking treatment at the wound center Timing: Pain in wound is Intermittent (comes and goes Context: The wound appeared gradually over time Modifying Factors: Consults to this date include: wound VAC applications and local care Associated Signs and Symptoms: Wound has significant periowound  erythema and localized edema HPI Description: 81 year old gentleman who had a stroke in February of this year and has been bedbound since then. Since March he has developed a pressure injury and ulceration and has been treated at assisted living with a wound VAC and other supportive care. His had a PEG tube in place and though he eats some food during the day he gets supplementary PEG tube feeds between 6 PM and 6 AM. Has not had diabetes mellitus and was pretty active before his illness. The patient was seen here in July and after that had a problem where he was [facility] and at that time treated with a wound VAC and then discharged to a skilled nursing facility. His plastic surgery appointment was canceled and he has not yet seen them. the nursing home has been changing his wound VAC. His nutrition has improved and is getting adequate tube feeds supplements and vitamins. 05/06/2015 -- was file [facility] Department of [facility]ive surgery and found to be unfit for surgery and reconstruction. His daughter confirms that they have not given him an appointment again. Electronic Signature(s) Signed: 05/06/2015 2:26:56 PM By: Evlyn KannerBritto, Aryka Coonradt MD, FACS Previous Signature: 05/06/2015 1:37:32 PM Version By: Evlyn KannerBritto, Jalee Saine MD, FACS Entered By: Evlyn KannerBritto, Ronnetta Currington on 05/06/2015 14:26:55 Townshend, Zyshonne ([id number]) -------------------------------------------------------------------------------- Physical Exam Details Patient Name: [name] [name] Date of Service: 05/06/2015 2:15 PM Medical Record Number: [id number] Patient Account Number: [id number] Date of Birth/Sex: 02/22/1931 [age] y.o. Male) Treating RN: Huel CoventryWoody, Kim Primary Care Physician: SYSTEM, PCP Other Clinician: Referring Physician: Audree BaneKING, PETER Treating Physician/Extender: Rudene ReBritto, Legacie Dillingham Weeks in Treatment: 2 Constitutional . Pulse regular. Respirations normal and unlabored. Afebrile. . Eyes Nonicteric.  Reactive to light. Ears, Nose, Mouth, and Throat Lips, teeth, and gums WNL.Marland Kitchen. Moist mucosa without lesions . Neck supple and nontender. No palpable supraclavicular or cervical adenopathy. Normal sized without goiter. Respiratory WNL. No  retractions.. Cardiovascular Pedal Pulses WNL. No clubbing, cyanosis or edema. Lymphatic No adneopathy. No adenopathy. No adenopathy. Musculoskeletal Adexa without tenderness or enlargement.. Digits and nails w/o clubbing, cyanosis, infection, petechiae, ischemia, or inflammatory conditions.. Integumentary (Hair, Skin) No suspicious lesions. No crepitus or fluctuance. No peri-wound warmth or erythema. No masses.Marland Kitchen Psychiatric Judgement and insight Intact.. No evidence of depression, anxiety, or agitation.. Notes the ulcer base is looking very clean and there is no undermining and there is healthy granulation tissue. Electronic Signature(s) Signed: 05/06/2015 2:27:35 PM By: Evlyn Kanner MD, FACS Entered By: Evlyn Kanner on 05/06/2015 14:27:34 Kham, Jehan ([id number]) -------------------------------------------------------------------------------- Physician Orders Details Patient Name: [name] [name] Date of Service: 05/06/2015 2:15 PM Medical Record Number: [id number] Patient Account Number: [id number] Date of Birth/Sex: Mar 07, 1931 (81 y.o. Male) Treating RN: Curtis Sites Primary Care Physician: SYSTEM, PCP Other Clinician: Referring Physician: Audree Bane Treating Physician/Extender: Rudene Re in Treatment: 2 Verbal / Phone Orders: Yes Clinician: Curtis Sites Read Back and Verified: Yes Diagnosis Coding Wound Cleansing Wound #2 Medial Sacrum o Clean wound with Normal Saline. Skin Barriers/Peri-Wound Care Wound #2 Medial Sacrum o Skin Prep Primary Wound Dressing Wound #2 Medial Sacrum o Aquacel Ag - at [facility]. Secondary Dressing Wound #2 Medial Sacrum o Boardered Foam Dressing - At [facility] Dressing Change  Frequency Wound #2 Medial Sacrum o Three times weekly Follow-up Appointments Wound #2 Medial Sacrum o Return Appointment in 2 weeks. Negative Pressure Wound Therapy Wound #2 Medial Sacrum o Wound VAC settings at 125/130 mmHg continuous pressure. Use BLACK/GREEN foam to wound cavity. Use WHITE foam to fill any tunnel/s and/or undermining. Change VAC dressing 3 X WEEK. Change canister as indicated when full. Nurse may titrate settings and frequency of dressing changes as clinically indicated. - To be applied by Skilled Nursing facility Electronic Signature(s) Signed: 05/06/2015 4:26:55 PM By: Evlyn Kanner MD, FACS Signed: 05/06/2015 5:44:39 PM By: Curtis Sites Stough, Myran ([id number]) Entered By: Curtis Sites on 05/06/2015 14:22:42 Battin, Montae ([id number]) -------------------------------------------------------------------------------- Problem List Details Patient Name: Tritz, Schon Date of Service: 05/06/2015 2:15 PM Medical Record Number: [id number] Patient Account Number: [id number] Date of Birth/Sex: 1930-11-26 (81 y.o. Male) Treating RN: Huel Coventry Primary Care Physician: SYSTEM, PCP Other Clinician: Referring Physician: Audree Bane Treating Physician/Extender: Rudene Re in Treatment: 2 Active Problems ICD-10 Encounter Code Description Active Date Diagnosis L89.153 Pressure ulcer of sacral region, stage 3 04/22/2015 Yes G81.90 Hemiplegia, unspecified affecting unspecified side 04/22/2015 Yes E44.1 Mild protein-calorie malnutrition 04/22/2015 Yes Inactive Problems Resolved Problems Electronic Signature(s) Signed: 05/06/2015 2:26:13 PM By: Evlyn Kanner MD, FACS Entered By: Evlyn Kanner on 05/06/2015 14:26:12 Lasorsa, Baden ([id number]) -------------------------------------------------------------------------------- Progress Note Details Patient Name: Brash, Celestine Date of Service: 05/06/2015 2:15 PM Medical Record Number: [id number] Patient  Account Number: [id number] Date of Birth/Sex: 1930/10/20 (81 y.o. Male) Treating RN: Huel Coventry Primary Care Physician: SYSTEM, PCP Other Clinician: Referring Physician: Audree Bane Treating Physician/Extender: Rudene Re in Treatment: 2 Subjective Chief Complaint Information obtained from Caregiver Patient is at the clinic for treatment of an open pressure ulcer. The patient comes along with his daughter who is the caregiver and lives in an assisted living facility and has had a ulcerative area on the sacrum for the last 6 months. History of Present Illness (HPI) The following HPI elements were documented for the patient's wound: Location: pressure injury to the sacral region and the left low back area. Quality: Patient reports experiencing a dull pain to affected area(s). Severity: Patient states wound are getting worse. Duration: Patient has  had the wound for > 3 months prior to seeking treatment at the wound center Timing: Pain in wound is Intermittent (comes and goes Context: The wound appeared gradually over time Modifying Factors: Consults to this date include: wound VAC applications and local care Associated Signs and Symptoms: Wound has significant periowound erythema and localized edema 81 year old gentleman who had a stroke in February of this year and has been bedbound since then. Since March he has developed a pressure injury and ulceration and has been treated at assisted living with a wound VAC and other supportive care. His had a PEG tube in place and though he eats some food during the day he gets supplementary PEG tube feeds between 6 PM and 6 AM. Has not had diabetes mellitus and was pretty active before his illness. The patient was seen here in July and after that had a problem where he was readmitted to hospital at [facility] and at that time treated with a wound VAC and then discharged to a skilled nursing facility. His plastic surgery appointment was  canceled and he has not yet seen them. the nursing home has been changing his wound VAC. His nutrition has improved and is getting adequate tube feeds supplements and vitamins. 05/06/2015 -- was file was reviewed at the [facility] Department of reconstructive surgery and found to be unfit for surgery and reconstruction. His daughter confirms that they have not given him an appointment again. Iten, Kordae ([id number]) Objective Constitutional Pulse regular. Respirations normal and unlabored. Afebrile. Vitals Time Taken: 1:50 PM, Temperature: 97.6 F, Pulse: 111 bpm, Respiratory Rate: 16 breaths/min, Blood Pressure: 133/69 mmHg. Eyes Nonicteric. Reactive to light. Ears, Nose, Mouth, and Throat Lips, teeth, and gums WNL.Marland Kitchen Moist mucosa without lesions . Neck supple and nontender. No palpable supraclavicular or cervical adenopathy. Normal sized without goiter. Respiratory WNL. No retractions.. Cardiovascular Pedal Pulses WNL. No clubbing, cyanosis or edema. Lymphatic No adneopathy. No adenopathy. No adenopathy. Musculoskeletal Adexa without tenderness or enlargement.. Digits and nails w/o clubbing, cyanosis, infection, petechiae, ischemia, or inflammatory conditions.Marland Kitchen Psychiatric Judgement and insight Intact.. No evidence of depression, anxiety, or agitation.. General Notes: the ulcer base is looking very clean and there is no undermining and there is healthy granulation tissue. Integumentary (Hair, Skin) No suspicious lesions. No crepitus or fluctuance. No peri-wound warmth or erythema. No masses.. Wound #2 status is Open. Original cause of wound was Pressure Injury. The wound is located on the Medial Sacrum. The wound measures 10.4cm length x 6.3cm width x 0.2cm depth; 51.459cm^2 area and 10.292cm^3 volume. The wound is limited to skin breakdown. There is no tunneling or undermining noted. There is a large amount of serosanguineous drainage noted. The wound margin is distinct with the  outline attached to the wound base. There is large (67-100%) red granulation within the wound bed. There is no necrotic tissue within the wound bed. The periwound skin appearance exhibited: Moist. The periwound skin appearance did not exhibit: Callus, Crepitus, Excoriation, Fluctuance, Friable, Induration, Localized Edema, Rash, Scarring, Dry/Scaly, Maceration, Atrophie Blanche, Cyanosis, Ecchymosis, Hemosiderin Staining, Riviera, Dawon ([id number]) Mottled, Pallor, Rubor, Erythema. Periwound temperature was noted as No Abnormality. Assessment Active Problems ICD-10 L89.153 - Pressure ulcer of sacral region, stage 3 G81.90 - Hemiplegia, unspecified affecting unspecified side E44.1 - Mild protein-calorie malnutrition The plastic surgery department at [facility] has rejected his appointment for reconstruction and hence we will continue with the wound VAC and local care including offloading. We will also continue with improving his nutrition and multivitamins and see  him back on a regular basis. Plan Wound Cleansing: Wound #2 Medial Sacrum: Clean wound with Normal Saline. Skin Barriers/Peri-Wound Care: Wound #2 Medial Sacrum: Skin Prep Primary Wound Dressing: Wound #2 Medial Sacrum: Aquacel Ag - at [facility]. Secondary Dressing: Wound #2 Medial Sacrum: Boardered Foam Dressing - At [facility] Dressing Change Frequency: Wound #2 Medial Sacrum: Three times weekly Follow-up Appointments: Wound #2 Medial Sacrum: Return Appointment in 2 weeks. Negative Pressure Wound Therapy: Wound #2 Medial Sacrum: Wound VAC settings at 125/130 mmHg continuous pressure. Use BLACK/GREEN foam to wound cavity. Use WHITE foam to fill any tunnel/s and/or undermining. Change VAC dressing 3 X WEEK. Change canister as indicated when full. Nurse may titrate settings and frequency of dressing changes as clinically indicated. - To be applied by Skilled Nursing facility Letts, Albi ([id number]) The plastic surgery department at [facility]  has rejected his appointment for reconstruction and hence we will continue with the wound VAC and local care including offloading. We will also continue with improving his nutrition and multivitamins and see him back on a regular basis. Electronic Signature(s) Signed: 05/06/2015 2:28:51 PM By: Evlyn Kanner MD, FACS Entered By: Evlyn Kanner on 05/06/2015 14:28:51 Quizhpi, Chilton ([id number]) -------------------------------------------------------------------------------- SuperBill Details Patient Name: Hietala, Mahad Date of Service: 05/06/2015 Medical Record Number: [id number] Patient Account Number: [id number] Date of Birth/Sex: 07-17-1930 (81 y.o. Male) Treating RN: Huel Coventry Primary Care Physician: SYSTEM, PCP Other Clinician: Referring Physician: Audree Bane Treating Physician/Extender: Rudene Re in Treatment: 2 Diagnosis Coding ICD-10 Codes Code Description [phone] Pressure ulcer of sacral region, stage 3 G81.90 Hemiplegia, unspecified affecting unspecified side E44.1 Mild protein-calorie malnutrition Facility Procedures CPT4 Code: [id number] Description: [phone] - WOUND CARE VISIT-LEV 2 EST PT Modifier: Quantity: 1 Physician Procedures CPT4 Code: [id number] Description: 99213 - WC PHYS LEVEL 3 - EST PT ICD-10 Description Diagnosis L89.153 Pressure ulcer of sacral region, stage 3 G81.90 Hemiplegia, unspecified affecting unspecified s E44.1 Mild protein-calorie malnutrition Modifier: ide Quantity: 1 Electronic Signature(s) Signed: 05/06/2015 2:29:05 PM By: Evlyn Kanner MD, FACS Entered By: Evlyn Kanner on 05/06/2015 14:29:05

## 2015-05-07 NOTE — Progress Notes (Signed)
[name] [name] ([id number]) Visit Report for 05/06/2015 Arrival Information Details Patient Name: [name] [name], [name] [name] Date of Service: 05/06/2015 2:15 PM Medical Record Number: [id number] Patient Account Number: [id number] Date of Birth/Sex: [birth year] (81 y.o. Male) Treating RN: [name] [name] Primary Care Physician: SYSTEM, PCP Other Clinician: Referring Physician: Audree [name] Treating Physician/Extender: [name] [name] in Treatment: 2 Visit Information History Since Last Visit Added or deleted any medications: No Patient Arrived: Stretcher Any new allergies or adverse reactions: No Arrival Time: 13:49 Had a fall or experienced change in No Accompanied By: dtr activities of daily living that may affect Transfer Assistance: Stretcher risk of falls: Patient Identification Verified: Yes Signs or symptoms of abuse/neglect since last No Secondary Verification Process Yes visito Completed: Hospitalized since last visit: No Patient Requires Transmission-Based No Pain Present Now: Yes Precautions: Patient Has Alerts: No Electronic Signature(s) Signed: 05/06/2015 5:44:39 PM By: [name] [name] Entered By: [name] [name] on 05/06/2015 13:50:29 [name] [name], [name] [name] ([id number]) -------------------------------------------------------------------------------- Clinic Level of Care Assessment Details Patient Name: [name] [name], [name] [name] Date of Service: 05/06/2015 2:15 PM Medical Record Number: [id number] Patient Account Number: [id number] Date of Birth/Sex: December 31, 1930 (81 y.o. Male) Treating RN: [name] [name] Primary Care Physician: SYSTEM, PCP Other Clinician: Referring Physician: Audree [name] Treating Physician/Extender: [name] [name] in Treatment: 2 Clinic Level of Care Assessment Items TOOL 4 Quantity Score  - Use when only an EandM is performed on FOLLOW-UP visit 0 ASSESSMENTS - Nursing Assessment / Reassessment X - Reassessment of Co-morbidities (includes updates in patient status) 1  10 X - Reassessment of Adherence to Treatment Plan 1 5 ASSESSMENTS - Wound and Skin Assessment / Reassessment X - Simple Wound Assessment / Reassessment - one wound 1 5  - Complex Wound Assessment / Reassessment - multiple wounds 0  - Dermatologic / Skin Assessment (not related to wound area) 0 ASSESSMENTS - Focused Assessment  - Circumferential Edema Measurements - multi extremities 0  - Nutritional Assessment / Counseling / Intervention 0  - Lower Extremity Assessment (monofilament, tuning fork, pulses) 0  - Peripheral Arterial Disease Assessment (using hand held doppler) 0 ASSESSMENTS - Ostomy and/or Continence Assessment and Care  - Incontinence Assessment and Management 0  - Ostomy Care Assessment and Management (repouching, etc.) 0 PROCESS - Coordination of Care X - Simple Patient / Family Education for ongoing care 1 15  - Complex (extensive) Patient / Family Education for ongoing care 0  - Staff obtains Chiropractor, Records, Test Results / Process Orders 0  - Staff telephones HHA, Nursing Homes / Clarify orders / etc 0  - Routine Transfer to another Facility (non-emergent condition) 0 [name] [name], [name] [name] ([id number])  - Routine Hospital Admission (non-emergent condition) 0  - New Admissions / Manufacturing engineer / Ordering NPWT, Apligraf, etc. 0  - Emergency Hospital Admission (emergent condition) 0 X - Simple Discharge Coordination 1 10  - Complex (extensive) Discharge Coordination 0 PROCESS - Special Needs  - Pediatric / Minor Patient Management 0  - Isolation Patient Management 0  - Hearing / Language / Visual special needs 0  - Assessment of Community assistance (transportation, D/C planning, etc.) 0  - Additional assistance / Altered mentation 0  - Support Surface(s) Assessment (bed, cushion, seat, etc.) 0 INTERVENTIONS - Wound Cleansing / Measurement X - Simple Wound Cleansing - one wound 1 5  - Complex Wound Cleansing -  multiple wounds 0 X - Wound Imaging (photographs - any number of wounds) 1 5  - Wound Tracing (instead of photographs) 0 X - Simple Wound Measurement - one  wound 1 5  - Complex Wound Measurement - multiple wounds 0 INTERVENTIONS - Wound Dressings X - Small Wound Dressing one or multiple wounds 1 10  - Medium Wound Dressing one or multiple wounds 0  - Large Wound Dressing one or multiple wounds 0  - Application of Medications - topical 0  - Application of Medications - injection 0 INTERVENTIONS - Miscellaneous  - External ear exam 0 [name] [name] ([id number])  - Specimen Collection (cultures, biopsies, blood, body fluids, etc.) 0  - Specimen(s) / Culture(s) sent or taken to Lab for analysis 0  - Patient Transfer (multiple staff / Michiel [name] Lift / Similar devices) 0  - Simple Staple / Suture removal (25 or less) 0  - Complex Staple / Suture removal (26 or more) 0  - Hypo / Hyperglycemic Management (close monitor of Blood Glucose) 0  - Ankle / Brachial Index (ABI) - do not check if billed separately 0 X - Vital Signs 1 5 Has the patient been seen at the hospital within the last three years: Yes Total Score: 75 Level Of Care: New/Established - Level 2 Electronic Signature(s) Signed: 05/06/2015 5:44:39 PM By: [name] [name] Entered By: [name] [name] on 05/06/2015 14:23:12 [name] [name], [name] [name] ([id number]) -------------------------------------------------------------------------------- Encounter Discharge Information Details Patient Name: [name] [name] Date of Service: 05/06/2015 2:15 PM Medical Record Number: [id number] Patient Account Number: [id number] Date of Birth/Sex: Dec 05, 1930 (81 y.o. Male) Treating RN: [name] [name] Primary Care Physician: SYSTEM, PCP Other Clinician: Referring Physician: Audree [name] Treating Physician/Extender: [name] [name] in Treatment: 2 Encounter Discharge Information Items Discharge Pain Level: 0 Discharge Condition:  Stable Ambulatory Status: Stretcher Discharge Destination: Nursing Home Transportation: Ambulance Accompanied By: dtr Schedule Follow-up Appointment: Yes Medication Reconciliation completed and provided to Patient/Care No Ioanna Colquhoun: Provided on Clinical Summary of Care: 05/06/2015 Form Type Recipient Paper Patient OS Electronic Signature(s) Signed: 05/06/2015 5:44:39 PM By: [name] [name] Previous Signature: 05/06/2015 2:35:47 PM Version By: Gwenlyn Perking Entered By: [name] [name] on 05/06/2015 15:42:48 [name] [name], [name] [name] ([id number]) -------------------------------------------------------------------------------- Multi Wound Chart Details Patient Name: Hourihan, Cha Date of Service: 05/06/2015 2:15 PM Medical Record Number: [id number] Patient Account Number: [id number] Date of Birth/Sex: 07/07/1931 (81 y.o. Male) Treating RN: [name] [name] Primary Care Physician: SYSTEM, PCP Other Clinician: Referring Physician: Audree [name] Treating Physician/Extender: [name] [name] in Treatment: 2 Vital Signs Height(in): Pulse(bpm): 111 Weight(lbs): Blood Pressure 133/69 (mmHg): Body Mass Index(BMI): Temperature(F): 97.6 Respiratory Rate 16 (breaths/min): Photos: [2:No Photos] [N/A:N/A] Wound Location: [2:Sacrum - Medial] [N/A:N/A] Wounding Event: [2:Pressure Injury] [N/A:N/A] Primary Etiology: [2:Pressure Ulcer] [N/A:N/A] Comorbid History: [2:Cataracts, Glaucoma, Anemia, Hypertension, Osteoarthritis] [N/A:N/A] Date Acquired: [2:12/09/2014] [N/A:N/A] Weeks of Treatment: [2:2] [N/A:N/A] Wound Status: [2:Open] [N/A:N/A] Measurements L x W x D 10.4x6.3x0.2 [N/A:N/A] (cm) Area (cm) : [2:51.459] [N/A:N/A] Volume (cm) : [2:10.292] [N/A:N/A] % Reduction in Area: [2:2.80%] [N/A:N/A] % Reduction in Volume: 2.80% [N/A:N/A] 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] [N/A:N/A] Exudate Amount: [2:Large] [N/A:N/A] Exudate Type: [2:Serosanguineous] [N/A:N/A] Exudate Color: [2:red, brown]  [N/A:N/A] Foul Odor After [2:Yes] [N/A:N/A] Cleansing: Odor Anticipated Due to No [N/A:N/A] Product Use: Wound Margin: [2:Distinct, outline attached] [N/A:N/A] Granulation Amount: [2:Large (67-100%)] [N/A:N/A] Granulation Quality: [2:Red] [N/A:N/A] Necrotic Amount: [2:None Present (0%)] [N/A:N/A] Exposed Structures: [2:Fascia: No Fat: No] [N/A:N/A] Tendon: No Muscle: No Joint: No Bone: No Limited to Skin Breakdown Epithelialization: None N/A N/A Periwound Skin Texture: Edema: No N/A N/A Excoriation: No Induration: No Callus: No Crepitus: No Fluctuance: No Friable: No Rash: No Scarring: No Periwound Skin Moist: Yes N/A N/A Moisture: Maceration: No Dry/Scaly: No Periwound Skin Color: Atrophie Blanche:  No N/A N/A Cyanosis: No Ecchymosis: No Erythema: No Hemosiderin Staining: No Mottled: No Pallor: No Rubor: No Temperature: No Abnormality N/A N/A Tenderness on No N/A N/A Palpation: Wound Preparation: Ulcer Cleansing: N/A N/A Rinsed/Irrigated with Saline Topical Anesthetic Applied: None Treatment Notes Electronic Signature(s) Signed: 05/06/2015 5:44:39 PM By: [name] Sitesorthy, [name] [name] Entered By: [name] Sitesorthy, [name] [name] on 05/06/2015 14:17:40 [name] [name], [name] [name] ([id number]) -------------------------------------------------------------------------------- Multi-Disciplinary Care Plan Details Patient Name: [name] [name], [name] [name] Date of Service: 05/06/2015 2:15 PM Medical Record Number: [id number] Patient Account Number: [id number] Date of Birth/Sex: 05/19/1931 [age] y.o. Male) Treating RN: [name] Sitesorthy, [name] [name] Primary Care Physician: SYSTEM, PCP Other Clinician: Referring Physician: Audree BaneKING, [name] [name] Treating Physician/Extender: [name] ReBritto, [name] [name] Weeks in Treatment: 2 Active Inactive Electronic Signature(s) Signed: 05/06/2015 5:44:39 PM By: [name] Sitesorthy, [name] [name] Entered By: [name] Sitesorthy, [name] [name] on 05/06/2015 14:17:32 [name] [name], [name] [name]  ([id number]) -------------------------------------------------------------------------------- Pain Assessment Details Patient Name: [name] [name], [name] [name] Date of Service: 05/06/2015 2:15 PM Medical Record Number: [id number] Patient Account Number: [id number] Date of Birth/Sex: 07/27/1930 [age] y.o. Male) Treating RN: [name] Sitesorthy, [name] [name] Primary Care Physician: SYSTEM, PCP Other Clinician: Referring Physician: Audree BaneKING, [name] [name] Treating Physician/Extender: [name] ReBritto, [name] [name] Weeks in Treatment: 2 Active Problems Location of Pain Severity and Description of Pain Patient Has Paino Yes Site Locations Pain Location: Pain in Ulcers With Dressing Change: Yes Duration of the Pain. Constant / Intermittento Constant Pain Management and Medication Current Pain Management: Electronic Signature(s) Signed: 05/06/2015 5:44:39 PM By: [name] Sitesorthy, [name] [name] Entered By: [name] Sitesorthy, [name] [name] on 05/06/2015 13:50:43 [name] [name], [name] [name] ([id number]) -------------------------------------------------------------------------------- Patient/Caregiver Education Details Patient Name: [name] [name], Arinze Date of Service: 05/06/2015 2:15 PM Medical Record Number: [id number] Patient Account Number: [id number] Date of Birth/Gender: 10/30/1930 [age] y.o. Male) Treating RN: [name] Sitesorthy, [name] [name] Primary Care Physician: SYSTEM, PCP Other Clinician: Referring Physician: Audree BaneKING, [name] [name] Treating Physician/Extender: [name] ReBritto, [name] [name] Weeks in Treatment: 2 Education Assessment Education Provided To: Patient and Caregiver Education Topics Provided Nutrition: Handouts: Other: nutrition for wound healing Methods: Explain/Verbal Responses: State content correctly Electronic Signature(s) Signed: 05/06/2015 5:44:39 PM By: [name] Sitesorthy, [name] [name] Entered By: [name] Sitesorthy, [name] [name] on 05/06/2015 14:24:16 Payne, Tryston ([id number]) -------------------------------------------------------------------------------- Wound Assessment Details Patient Name: Brierley, Eleuterio Date of Service: 05/06/2015 2:15  PM Medical Record Number: [id number] Patient Account Number: [id number] Date of Birth/Sex: 10/18/1930 [age] y.o. Male) Treating RN: [name] Sitesorthy, [name] [name] Primary Care Physician: SYSTEM, PCP Other Clinician: Referring Physician: Audree BaneKING, [name] [name] Treating Physician/Extender: [name] ReBritto, [name] [name] Weeks in Treatment: 2 Wound Status Wound Number: 2 Primary Pressure Ulcer Etiology: Wound Location: Sacrum - Medial Wound Open Wounding Event: Pressure Injury Status: Date Acquired: 12/09/2014 Comorbid Cataracts, Glaucoma, Anemia, Weeks Of Treatment: 2 History: Hypertension, Osteoarthritis Clustered Wound: No Photos Photo Uploaded By: [name] Sitesorthy, [name] [name] on 05/06/2015 16:57:49 Wound Measurements Length: (cm) 10.4 Width: (cm) 6.3 Depth: (cm) 0.2 Area: (cm) 51.459 Volume: (cm) 10.292 % Reduction in Area: 2.8% % Reduction in Volume: 2.8% Epithelialization: None Tunneling: No Undermining: No Wound Description Classification: Category/Stage IV Foul Odor Af Wound Margin: Distinct, outline attached Due to Produ Exudate Amount: Large Exudate Type: Serosanguineous Exudate Color: red, brown ter Cleansing: Yes ct Use: No Wound Bed Granulation Amount: Large (67-100%) Exposed Structure Granulation Quality: Red Fascia Exposed: No Necrotic Amount: None Present (0%) Fat Layer Exposed: No Tendon Exposed: No Martinez, Larrie ([id number]) Muscle Exposed: No Joint Exposed: No Bone Exposed: No Limited to Skin Breakdown Periwound Skin Texture Texture Color No Abnormalities Noted: No No Abnormalities Noted: No Callus: No Atrophie Blanche: No Crepitus: No Cyanosis: No Excoriation: No Ecchymosis: No Fluctuance: No Erythema: No Friable: No Hemosiderin Staining: No Induration: No Mottled: No Localized Edema: No Pallor: No Rash: No Rubor: No Scarring:  No Temperature / Pain Moisture Temperature: No Abnormality No Abnormalities Noted: No Dry / Scaly: No Maceration: No Moist: Yes Wound Preparation Ulcer  Cleansing: Rinsed/Irrigated with Saline Topical Anesthetic Applied: None Treatment Notes Wound #2 (Medial Sacrum) 1. Cleansed with: Clean wound with Normal Saline 3. Peri-wound Care: Skin Prep 4. Dressing Applied: Aquacel Ag 5. Secondary Dressing Applied Bordered Foam Dressing Electronic Signature(s) Signed: 05/06/2015 5:44:39 PM By: [name] [name] Entered By: [name] [name] on 05/06/2015 14:17:25 Crisanti, Trayshawn ([id number]) -------------------------------------------------------------------------------- Vitals Details Patient Name: Russett, Dent Date of Service: 05/06/2015 2:15 PM Medical Record Number: [id number] Patient Account Number: [id number] Date of Birth/Sex: 1931-07-02 (81 y.o. Male) Treating RN: [name] [name] Primary Care Physician: SYSTEM, PCP Other Clinician: Referring Physician: Audree [name] Treating Physician/Extender: [name] [name] in Treatment: 2 Vital Signs Time Taken: 13:50 Temperature (F): 97.6 Pulse (bpm): 111 Respiratory Rate (breaths/min): 16 Blood Pressure (mmHg): 133/69 Reference Range: 80 - 120 mg / dl Electronic Signature(s) Signed: 05/06/2015 5:44:39 PM By: [name] [name] Entered By: [name] [name] on 05/06/2015 13:52:40

## 2015-05-11 ENCOUNTER — Observation Stay
Admission: EM | Admit: 2015-05-11 | Discharge: 2015-05-12 | Disposition: A | Discharge status: Skilled Nursing Facility | Payer: Medicare Other | Attending: Internal Medicine | Admitting: Internal Medicine

## 2015-05-11 ENCOUNTER — Encounter: Payer: Self-pay | Admitting: Emergency Medicine

## 2015-05-11 ENCOUNTER — Emergency Department: Payer: Medicare Other

## 2015-05-11 DIAGNOSIS — K219 Gastro-esophageal reflux disease without esophagitis: Secondary | ICD-10-CM | POA: Insufficient documentation

## 2015-05-11 DIAGNOSIS — G8194 Hemiplegia, unspecified affecting left nondominant side: Secondary | ICD-10-CM | POA: Diagnosis not present

## 2015-05-11 DIAGNOSIS — I10 Essential (primary) hypertension: Secondary | ICD-10-CM | POA: Insufficient documentation

## 2015-05-11 DIAGNOSIS — R1110 Vomiting, unspecified: Secondary | ICD-10-CM | POA: Insufficient documentation

## 2015-05-11 DIAGNOSIS — L89153 Pressure ulcer of sacral region, stage 3: Secondary | ICD-10-CM | POA: Insufficient documentation

## 2015-05-11 DIAGNOSIS — N400 Enlarged prostate without lower urinary tract symptoms: Secondary | ICD-10-CM | POA: Diagnosis not present

## 2015-05-11 DIAGNOSIS — K920 Hematemesis: Secondary | ICD-10-CM | POA: Diagnosis present

## 2015-05-11 DIAGNOSIS — J45909 Unspecified asthma, uncomplicated: Secondary | ICD-10-CM | POA: Diagnosis not present

## 2015-05-11 DIAGNOSIS — Z91018 Allergy to other foods: Secondary | ICD-10-CM | POA: Diagnosis not present

## 2015-05-11 DIAGNOSIS — Z982 Presence of cerebrospinal fluid drainage device: Secondary | ICD-10-CM | POA: Insufficient documentation

## 2015-05-11 DIAGNOSIS — Z888 Allergy status to other drugs, medicaments and biological substances status: Secondary | ICD-10-CM | POA: Insufficient documentation

## 2015-05-11 DIAGNOSIS — F0390 Unspecified dementia without behavioral disturbance: Secondary | ICD-10-CM | POA: Insufficient documentation

## 2015-05-11 DIAGNOSIS — K922 Gastrointestinal hemorrhage, unspecified: Secondary | ICD-10-CM | POA: Diagnosis present

## 2015-05-11 DIAGNOSIS — Z8249 Family history of ischemic heart disease and other diseases of the circulatory system: Secondary | ICD-10-CM | POA: Diagnosis not present

## 2015-05-11 DIAGNOSIS — Z885 Allergy status to narcotic agent status: Secondary | ICD-10-CM | POA: Insufficient documentation

## 2015-05-11 DIAGNOSIS — Z79899 Other long term (current) drug therapy: Secondary | ICD-10-CM | POA: Diagnosis not present

## 2015-05-11 DIAGNOSIS — L8990 Pressure ulcer of unspecified site, unspecified stage: Secondary | ICD-10-CM | POA: Diagnosis present

## 2015-05-11 DIAGNOSIS — K649 Unspecified hemorrhoids: Secondary | ICD-10-CM | POA: Diagnosis not present

## 2015-05-11 DIAGNOSIS — I69354 Hemiplegia and hemiparesis following cerebral infarction affecting left non-dominant side: Secondary | ICD-10-CM | POA: Diagnosis not present

## 2015-05-11 HISTORY — DX: Pressure ulcer of sacral region, unspecified stage: L89.159

## 2015-05-11 HISTORY — DX: Unspecified hemorrhoids: K64.9

## 2015-05-11 HISTORY — DX: Unspecified asthma, uncomplicated: J45.909

## 2015-05-11 HISTORY — DX: Presence of cerebrospinal fluid drainage device: Z98.2

## 2015-05-11 HISTORY — DX: Benign prostatic hyperplasia without lower urinary tract symptoms: N40.0

## 2015-05-11 HISTORY — DX: Gastro-esophageal reflux disease without esophagitis: K21.9

## 2015-05-11 LAB — CBC
HEMATOCRIT: 43.2 % (ref 40.0–52.0)
HEMOGLOBIN: 13.5 g/dL (ref 13.0–18.0)
MCH: 23.5 pg — ABNORMAL LOW (ref 26.0–34.0)
MCHC: 31.3 g/dL — ABNORMAL LOW (ref 32.0–36.0)
MCV: 75.1 fL — ABNORMAL LOW (ref 80.0–100.0)
Platelets: 164 10*3/uL (ref 150–440)
RBC: 5.76 MIL/uL (ref 4.40–5.90)
RDW: 20 % — AB (ref 11.5–14.5)
WBC: 9.7 10*3/uL (ref 3.8–10.6)

## 2015-05-11 LAB — PH, GASTRIC FLUID (GASTROCCULT CARD): PH, GASTRIC: 2

## 2015-05-11 LAB — COMPREHENSIVE METABOLIC PANEL
ALT: 18 U/L (ref 17–63)
ANION GAP: 8 (ref 5–15)
AST: 26 U/L (ref 15–41)
Albumin: 3.6 g/dL (ref 3.5–5.0)
Alkaline Phosphatase: 94 U/L (ref 38–126)
BILIRUBIN TOTAL: 0.6 mg/dL (ref 0.3–1.2)
BUN: 25 mg/dL — AB (ref 6–20)
CO2: 28 mmol/L (ref 22–32)
Calcium: 9.2 mg/dL (ref 8.9–10.3)
Chloride: 104 mmol/L (ref 101–111)
Creatinine, Ser: 0.49 mg/dL — ABNORMAL LOW (ref 0.61–1.24)
Glucose, Bld: 110 mg/dL — ABNORMAL HIGH (ref 65–99)
POTASSIUM: 4.4 mmol/L (ref 3.5–5.1)
Sodium: 140 mmol/L (ref 135–145)
TOTAL PROTEIN: 7.3 g/dL (ref 6.5–8.1)

## 2015-05-11 LAB — TYPE AND SCREEN
ABO/RH(D): O POS
ANTIBODY SCREEN: NEGATIVE

## 2015-05-11 LAB — URINALYSIS COMPLETE WITH MICROSCOPIC (ARMC ONLY)
BILIRUBIN URINE: NEGATIVE
Bacteria, UA: NONE SEEN
GLUCOSE, UA: NEGATIVE mg/dL
Hgb urine dipstick: NEGATIVE
KETONES UR: NEGATIVE mg/dL
Leukocytes, UA: NEGATIVE
Nitrite: NEGATIVE
PH: 6 (ref 5.0–8.0)
Protein, ur: NEGATIVE mg/dL
Specific Gravity, Urine: 1.023 (ref 1.005–1.030)

## 2015-05-11 LAB — PROTIME-INR
INR: 0.94
Prothrombin Time: 12.8 seconds (ref 11.4–15.0)

## 2015-05-11 LAB — ABO/RH: ABO/RH(D): O POS

## 2015-05-11 LAB — LIPASE, BLOOD: Lipase: 18 U/L (ref 11–51)

## 2015-05-11 LAB — HEMOGLOBIN: Hemoglobin: 12.1 g/dL — ABNORMAL LOW (ref 13.0–18.0)

## 2015-05-11 LAB — MRSA PCR SCREENING: MRSA BY PCR: POSITIVE — AB

## 2015-05-11 MED ORDER — ACETAMINOPHEN 325 MG PO TABS
650.0000 mg | ORAL_TABLET | Freq: Four times a day (QID) | ORAL | Status: DC | PRN
  Administered 2015-05-12: 12:00:00 650 mg via ORAL
  Filled 2015-05-11: qty 2

## 2015-05-11 MED ORDER — BISACODYL 10 MG RE SUPP: 10.0000 mg | Freq: Every day | RECTAL | Status: DC | PRN

## 2015-05-11 MED ORDER — MORPHINE SULFATE (PF) 2 MG/ML IV SOLN
1.0000 mg | INTRAVENOUS | Status: DC | PRN
  Filled 2015-05-11: qty 1

## 2015-05-11 MED ORDER — ALBUTEROL SULFATE (2.5 MG/3ML) 0.083% IN NEBU: 2.5000 mg | INHALATION_SOLUTION | RESPIRATORY_TRACT | Status: DC | PRN

## 2015-05-11 MED ORDER — OXYCODONE HCL 5 MG/5ML PO SOLN
2.5000 mg | ORAL | Status: AC
  Administered 2015-05-11: 2.5 mg
  Filled 2015-05-11: qty 5

## 2015-05-11 MED ORDER — BRIMONIDINE TARTRATE 0.2 % OP SOLN
1.0000 [drp] | Freq: Two times a day (BID) | OPHTHALMIC | Status: DC
  Administered 2015-05-11 – 2015-05-12 (×2): 1 [drp] via OPHTHALMIC
  Filled 2015-05-11: qty 5

## 2015-05-11 MED ORDER — ACETAMINOPHEN 160 MG/5ML PO SOLN
650.0000 mg | Freq: Once | ORAL | Status: AC
  Administered 2015-05-11: 650 mg
  Filled 2015-05-11: qty 20.3

## 2015-05-11 MED ORDER — ACETAMINOPHEN 650 MG RE SUPP: 650.0000 mg | Freq: Four times a day (QID) | RECTAL | Status: DC | PRN

## 2015-05-11 MED ORDER — SODIUM CHLORIDE 0.9 % IV SOLN
INTRAVENOUS | Status: DC
  Administered 2015-05-11 – 2015-05-12 (×2): via INTRAVENOUS

## 2015-05-11 MED ORDER — LATANOPROST 0.005 % OP SOLN
1.0000 [drp] | Freq: Every day | OPHTHALMIC | Status: DC
  Administered 2015-05-11 – 2015-05-12 (×2): 1 [drp] via OPHTHALMIC
  Filled 2015-05-11: qty 2.5

## 2015-05-11 MED ORDER — PANTOPRAZOLE SODIUM 40 MG IV SOLR
40.0000 mg | Freq: Once | INTRAVENOUS | Status: AC
  Administered 2015-05-11: 40 mg via INTRAVENOUS
  Filled 2015-05-11: qty 40

## 2015-05-11 MED ORDER — MUPIROCIN 2 % EX OINT
1.0000 "application " | TOPICAL_OINTMENT | Freq: Two times a day (BID) | CUTANEOUS | Status: DC
  Administered 2015-05-11 – 2015-05-12 (×2): 1 via NASAL
  Filled 2015-05-11: qty 22

## 2015-05-11 MED ORDER — ACETAMINOPHEN 160 MG/5ML PO SUSP
ORAL | Status: AC
  Administered 2015-05-11: 650 mg
  Filled 2015-05-11: qty 25

## 2015-05-11 MED ORDER — MENTHOL (TOPICAL ANALGESIC) 4 % EX GEL: 1.0000 "application " | Freq: Three times a day (TID) | CUTANEOUS | Status: DC

## 2015-05-11 MED ORDER — SODIUM CHLORIDE 0.9 % IV BOLUS (SEPSIS)
500.0000 mL | Freq: Once | INTRAVENOUS | Status: AC
  Administered 2015-05-11: 500 mL via INTRAVENOUS

## 2015-05-11 MED ORDER — CHLORHEXIDINE GLUCONATE CLOTH 2 % EX PADS
6.0000 | MEDICATED_PAD | Freq: Every day | CUTANEOUS | Status: DC
  Administered 2015-05-12: 6 via TOPICAL

## 2015-05-11 MED ORDER — PANTOPRAZOLE SODIUM 40 MG IV SOLR
40.0000 mg | Freq: Two times a day (BID) | INTRAVENOUS | Status: DC
  Administered 2015-05-11 – 2015-05-12 (×2): 40 mg via INTRAVENOUS
  Filled 2015-05-11 (×2): qty 40

## 2015-05-11 MED ORDER — LIDOCAINE 5 % EX OINT
1.0000 "application " | TOPICAL_OINTMENT | CUTANEOUS | Status: DC
  Filled 2015-05-11: qty 35.44

## 2015-05-11 MED ORDER — COLLAGENASE 250 UNIT/GM EX OINT
1.0000 "application " | TOPICAL_OINTMENT | CUTANEOUS | Status: DC
  Filled 2015-05-11: qty 30

## 2015-05-11 MED ORDER — ONDANSETRON HCL 4 MG PO TABS: 4.0000 mg | ORAL_TABLET | Freq: Four times a day (QID) | ORAL | Status: DC | PRN

## 2015-05-11 MED ORDER — ONDANSETRON HCL 4 MG/2ML IJ SOLN: 4.0000 mg | Freq: Four times a day (QID) | INTRAMUSCULAR | Status: DC | PRN

## 2015-05-11 NOTE — ED Notes (Signed)
Correction, blood in vomit only.

## 2015-05-11 NOTE — H&P (Signed)
[facility] [place] [facility] [place] [facility]   PATIENT NAME: Mercy MooreOtis Asfaw    MR#:  [id number]  DATE OF BIRTH:  06/13/1931  DATE OF ADMISSION:  05/11/2015  PRIMARY CARE PHYSICIAN: No PCP Per Patient   REQUESTING/REFERRING PHYSICIAN: Dr. Fanny BienQuale  CHIEF COMPLAINT:   Chief Complaint  Patient presents with  . GI Bleeding    HISTORY OF PRESENT ILLNESS:  Mercy MooreOtis Turnley  is a 81 y.o. male with a known history of intracranial hemorrhage, hypertension, sacral wound, GERD, presents to the emergency room from nursing home after he had 2 episodes of vomiting with blood in it. No blood in stool noticed. Patient's mental status is the same. Afebrile. Patient unable to contribute to history. History obtained from records, family at bedside and emergency room staff. Gastroccult test was positive for blood in the emergency room. Stool negative for blood. Not on anticoagulation. Hemoglobin is 13.5. It was 9.6, 6 weeks back. In the emergency room.  PAST MEDICAL HISTORY:   Past Medical History  Diagnosis Date  . Stroke (HCC)   . Hypertension   . GERD (gastroesophageal reflux disease)   . Hemorrhoids   . Asthma   . BPH (benign prostatic hyperplasia)   . VP (ventriculoperitoneal) shunt status   . Sacral decubitus ulcer     PAST SURGICAL HISTORY:  History reviewed. No pertinent past surgical history.  SOCIAL HISTORY:   Social History  Substance Use Topics  . Smoking status: Never Smoker   . Smokeless tobacco: Not on file  . Alcohol Use: No    FAMILY HISTORY:   Family History  Problem Relation Age of Onset  . CAD Mother     DRUG ALLERGIES:   Allergies  Allergen Reactions  . Atorvastatin     Other reaction(s): Other Liver function abnormality  . Prednisone     Other reaction(s): Other Other Reaction: tingling, weakness  . Silodosin Swelling    Swelling in throat  . Warfarin Sodium     Other reaction(s): Other Clinically likely amyloid angiopathy.  High risk  of fatal ICH. H/O ICH.  Antiplatelets and anticoagulation for DVT prophylaxis okay.  . Gabapentin Swelling  . Metoprolol Tartrate   . Orange Juice [Orange Oil] Other (See Comments)    Unknown reaction  . Pregabalin Swelling  . Flomax  [Tamsulosin Hcl]     Other reaction(s): Other Numbness in arm/ sweats  . Morphine And Related Swelling    Other reaction(s): Other Worsened asthma    REVIEW OF SYSTEMS:   Review of Systems  Unable to perform ROS: dementia    MEDICATIONS AT HOME:   Prior to Admission medications   Medication Sig Start Date End Date Taking? Authorizing Provider  acetaminophen (TYLENOL) 325 MG tablet Place 650 mg into feeding tube every 4 (four) hours as needed for moderate pain.    Historical Provider, MD  acetaminophen (TYLENOL) 500 MG tablet 1,000 mg by Per NG tube route every 8 (eight) hours.    Historical Provider, MD  amLODipine (NORVASC) 10 MG tablet 10 mg by Per NG tube route daily.    Historical Provider, MD  ascorbic acid (VITAMIN C) 500 MG/5ML syrup Place 500 mg into feeding tube 2 (two) times daily.    Historical Provider, MD  brimonidine (ALPHAGAN) 0.2 % ophthalmic solution Place 1 drop into both eyes 2 (two) times daily.    Historical Provider, MD  cholecalciferol (VITAMIN D) 1000 UNITS tablet Take 1,000 Units by mouth daily.    Historical Provider,  MD  collagenase (SANTYL) ointment Apply 1 application topically 3 (three) times a week. Apply to ischial wound on Monday, Wednesday, and Friday, every day shift    Historical Provider, MD  ferrous sulfate 325 (65 FE) MG tablet Take 325 mg by mouth 2 (two) times daily.    Historical Provider, MD  fluticasone (FLONASE) 50 MCG/ACT nasal spray Place 1 spray into both nostrils daily.    Historical Provider, MD  guaiFENesin 200 MG tablet Take 1 tablet (200 mg total) by mouth every 4 (four) hours as needed for cough or to loosen phlegm. 03/20/15   Minna Antis, MD  hydrALAZINE (APRESOLINE) 25 MG tablet Take 25 mg by  mouth every 6 (six) hours.    Historical Provider, MD  ipratropium-albuterol (DUONEB) 0.5-2.5 (3) MG/3ML SOLN Take 3 mLs by nebulization every 6 (six) hours as needed (shortness of breath or wheezing).    Historical Provider, MD  lamoTRIgine (LAMICTAL) 25 MG tablet Take 25 mg by mouth daily.    Historical Provider, MD  lansoprazole (PREVACID SOLUTAB) 30 MG disintegrating tablet Take 30 mg by mouth daily.    Historical Provider, MD  latanoprost (XALATAN) 0.005 % ophthalmic solution Place 1 drop into both eyes at bedtime.    Historical Provider, MD  lidocaine (XYLOCAINE) 5 % ointment Apply 1 application topically 3 (three) times a week. Apply to peri-anal area on Monday, Wednesday, and Friday, every day shift for before wound vac change    Historical Provider, MD  loperamide (IMODIUM A-D) 2 MG tablet Take 2 mg by mouth every 4 (four) hours as needed for diarrhea or loose stools.    Historical Provider, MD  loperamide (IMODIUM) 1 MG/5ML solution Take 5 mLs by mouth every 8 (eight) hours as needed for diarrhea or loose stools.     Historical Provider, MD  Multiple Vitamin (MULTIVITAMIN) LIQD Place 5 mLs into feeding tube daily.    Historical Provider, MD  potassium chloride 20 MEQ/15ML (10%) SOLN Take 20 mEq by mouth daily.    Historical Provider, MD  selenium 50 MCG TABS tablet Take 50 mcg by mouth daily.    Historical Provider, MD  traMADol (ULTRAM) 50 MG tablet Take 50 mg by mouth every 8 (eight) hours as needed for moderate pain.    Historical Provider, MD  zinc sulfate 220 MG capsule Take 220 mg by mouth daily.    Historical Provider, MD      VITAL SIGNS:  Blood pressure 101/63, pulse 97, temperature 98.6 F (37 C), temperature source Oral, resp. rate 25, height  (1.676 m), weight 72.576 kg (160 lb), SpO2 95 %.  PHYSICAL EXAMINATION:  Physical Exam  GENERAL:  81 y.o.-year-old patient lying in the bed with no acute distress.  EYES: Pupils equal, round, reactive to light and  accommodation. No scleral icterus. Extraocular muscles intact.  HEENT: Head atraumatic, normocephalic. Oropharynx and nasopharynx clear. No oropharyngeal erythema, moist oral mucosa . VP shunt felt NECK:  Supple, no jugular venous distention. No thyroid enlargement, no tenderness.  LUNGS: Normal breath sounds bilaterally, no wheezing, rales, rhonchi. No use of accessory muscles of respiration.  CARDIOVASCULAR: S1, S2 normal. No murmurs, rubs, or gallops.  ABDOMEN: Soft, nontender, nondistended. Bowel sounds present. No organomegaly or mass. PEG tube in place EXTREMITIES: No pedal edema, cyanosis, or clubbing. + 2 pedal & radial pulses b/l.   NEUROLOGIC: Left hemiparesis contractors PSYCHIATRIC: The patient is alert and awake SKIN: Large sacral wound with wound VAC in place  LABORATORY PANEL:  CBC  Recent Labs Lab 05/11/15 1149  WBC 9.7  HGB 13.5  HCT 43.2  PLT 164   ------------------------------------------------------------------------------------------------------------------  Chemistries   Recent Labs Lab 05/11/15 1149  NA 140  K 4.4  CL 104  CO2 28  GLUCOSE 110*  BUN 25*  CREATININE 0.49*  CALCIUM 9.2  AST 26  ALT 18  ALKPHOS 94  BILITOT 0.6   ------------------------------------------------------------------------------------------------------------------  Cardiac Enzymes No results for input(s): TROPONINI in the last 168 hours. ------------------------------------------------------------------------------------------------------------------  RADIOLOGY:  Dg Abd Portable 1v  05/11/2015  CLINICAL DATA:  Hematemesis today, left-sided paralysis EXAM: PORTABLE ABDOMEN - 1 VIEW COMPARISON:  None. FINDINGS: Supine radiograph demonstrates elevation of the right diaphragm. Spinal stimulator noted. Stool retained throughout the colon. No abnormally dilated loops of bowel. IMPRESSION: Evidence of constipation. Electronically Signed   By: Esperanza Heir M.D.   On:  05/11/2015 14:25     IMPRESSION AND PLAN:   * Upper GI bleed 2 episodes of vomiting with blood earlier today. Gastric occult past was positive for blood in the emergency room. Stool negative for blood. No further vomiting at this time. Etiology is unclear. Could be gastritis OR peptic ulcer disease or bleeding from the PEG site. Patient will be admitted under observation for serial hemoglobin and monitoring. Nothing by mouth. Start twice a day Protonix IV. Consult GI if any further bleeding noticed. I don't think he needs an endoscopy at this point. Patient's hemoglobin is significantly elevated compared to a month back, increasing from 9.6-13.5. This is likely hemoconcentration from dehydration and will likely drop with fluid resuscitation while in the hospital.  * Constipation Abdominal x-ray shows significant stool, burden. Vomiting could be likely from this. Dulcolax suppository.  * Sacral wound which warm complaints Wound Team consultation  * Dementia Watch for inpatient delirium which seems to be a common issue with patient. As per family.  * DVT prophylaxis with SCDs  All the records are reviewed and case discussed with ED provider. Management plans discussed with the patient, family and they are in agreement.  CODE STATUS: FULL CODE  TOTAL TIME TAKING CARE OF THIS PATIENT: 40 minutes.    Milagros Loll R M.D on 05/11/2015 at 3:39 PM  Between 7am to 6pm - Pager - [phone]  After 6pm go to [email or web address] - password [email or web address] [facility]  [facility]  Office  [phone]  CC: Primary care physician; No PCP Per Patient   Note: This dictation was prepared with Dragon dictation along with smaller phrase technology. Any transcriptional errors that result from this process are unintentional.

## 2015-05-11 NOTE — Plan of Care (Signed)
Problem: Discharge Progression Outcomes Goal: Barriers To Progression Addressed/Resolved Individualization of Care Outcome: Progressing 1. Pt likes to be called [name] [name] 2. From [facility] 3. Hx of DM, HTN, TIA, HLD, GERD, MI, and lymphoma controlled by home meds Goal: Other Discharge Outcomes/Goals Outcome: Progressing Plan of care progress to goal for: 1. Pain-no c/o pain, pt resting comfortably 2. Hemodynamically-             -VSS, afebrile              -IVF infusing per orders 3. Complications-no evidence of this shift 4. Diet-pt is NPO 5. Activity-high falls, pt is total care

## 2015-05-11 NOTE — ED Notes (Signed)
Pt arrived via EMS from  [facility] with possible GI bleed and blood in stool.

## 2015-05-11 NOTE — ED Notes (Signed)
Pt from [facility], sent for evaluation of blood in vomit this AM reported by nurse. Unsure if patient on blood thinners. Pt family at bedside. Pt arrives with wound and wound vac present.

## 2015-05-11 NOTE — ED Notes (Signed)
Sacral wound with wound vac to pt buttocks, Stage 3. Pt also has g tube to left lower abdomen, site looks normal-dark fluid in gtube.

## 2015-05-11 NOTE — ED Notes (Addendum)
Attempt to catherize patient, no urine output at this time . Pt is incontinent, brief changed, patient turned.

## 2015-05-11 NOTE — ED Provider Notes (Signed)
[facility] Emergency Department Provider Note REMINDER - THIS NOTE IS NOT A FINAL MEDICAL RECORD UNTIL IT IS SIGNED. UNTIL THEN, THE CONTENT BELOW MAY REFLECT INFORMATION FROM A DOCUMENTATION TEMPLATE, NOT THE ACTUAL PATIENT VISIT. ____________________________________________  Time seen: Approximately 12:35 PM  I have reviewed the triage vital signs and the nursing notes.   HISTORY  Chief Complaint GI Bleeding    HPI [name] [name] is a 81 y.o. male has had a very complicated medical history recently after he had an intracranial hemorrhage leaving him with left-sided hemiparesis. Patient presents today after reported episode of vomiting up blood twice at his nursing facility. He does not recall this very well, but his family reports that they were called and told that he vomited up blood twice. Patient denies being in any pain or discomfort except on his buttock where he has a wound VAC any known stage IV decubitus ulcer.  No fevers or chills, no chest pain or trouble breathing. He does not have any abdominal pain, reports that his G-tube is due for replacement, and facilities working to have this performed soon. He does have a history of sepsis from urinary tract infection previously, but has not had any fever or signs or symptoms of recent illness. He has no history of any bleeding disorder, and he does not take any blood thinners.  Family reports he is in his normal state of health at this time, but are concerned about the possible bleeding he had.   Past Medical History  Diagnosis Date  . Stroke (HCC)   . Hypertension     There are no active problems to display for this patient.   History reviewed. No pertinent past surgical history.  Current Outpatient Rx  Name  Route  Sig  Dispense  Refill  . acetaminophen (TYLENOL) 325 MG tablet   Per Tube   Place 650 mg into feeding tube every 4 (four) hours as needed for moderate pain.         Marland Kitchen acetaminophen  (TYLENOL) 500 MG tablet   Per NG tube   1,000 mg by Per NG tube route every 8 (eight) hours.         Marland Kitchen amLODipine (NORVASC) 10 MG tablet   Per NG tube   10 mg by Per NG tube route daily.         Marland Kitchen ascorbic acid (VITAMIN C) 500 MG/5ML syrup   Per Tube   Place 500 mg into feeding tube 2 (two) times daily.         . brimonidine (ALPHAGAN) 0.2 % ophthalmic solution   Both Eyes   Place 1 drop into both eyes 2 (two) times daily.         . cholecalciferol (VITAMIN D) 1000 UNITS tablet   Oral   Take 1,000 Units by mouth daily.         . collagenase (SANTYL) ointment   Topical   Apply 1 application topically 3 (three) times a week. Apply to ischial wound on Monday, Wednesday, and Friday, every day shift         . ferrous sulfate 325 (65 FE) MG tablet   Oral   Take 325 mg by mouth 2 (two) times daily.         . fluticasone (FLONASE) 50 MCG/ACT nasal spray   Each Nare   Place 1 spray into both nostrils daily.         Marland Kitchen guaiFENesin 200 MG tablet   Oral  Take 1 tablet (200 mg total) by mouth every 4 (four) hours as needed for cough or to loosen phlegm.   30 suppository   0   . hydrALAZINE (APRESOLINE) 25 MG tablet   Oral   Take 25 mg by mouth every 6 (six) hours.         Marland Kitchen. ipratropium-albuterol (DUONEB) 0.5-2.5 (3) MG/3ML SOLN   Nebulization   Take 3 mLs by nebulization every 6 (six) hours as needed (shortness of breath or wheezing).         Marland Kitchen. lamoTRIgine (LAMICTAL) 25 MG tablet   Oral   Take 25 mg by mouth daily.         . lansoprazole (PREVACID SOLUTAB) 30 MG disintegrating tablet   Oral   Take 30 mg by mouth daily.         Marland Kitchen. latanoprost (XALATAN) 0.005 % ophthalmic solution   Both Eyes   Place 1 drop into both eyes at bedtime.         . lidocaine (XYLOCAINE) 5 % ointment   Topical   Apply 1 application topically 3 (three) times a week. Apply to peri-anal area on Monday, Wednesday, and Friday, every day shift for before wound vac change          . loperamide (IMODIUM A-D) 2 MG tablet   Oral   Take 2 mg by mouth every 4 (four) hours as needed for diarrhea or loose stools.         Marland Kitchen. loperamide (IMODIUM) 1 MG/5ML solution   Oral   Take 5 mLs by mouth every 8 (eight) hours as needed for diarrhea or loose stools.          . Multiple Vitamin (MULTIVITAMIN) LIQD   Per Tube   Place 5 mLs into feeding tube daily.         . potassium chloride 20 MEQ/15ML (10%) SOLN   Oral   Take 20 mEq by mouth daily.         Marland Kitchen. selenium 50 MCG TABS tablet   Oral   Take 50 mcg by mouth daily.         . traMADol (ULTRAM) 50 MG tablet   Oral   Take 50 mg by mouth every 8 (eight) hours as needed for moderate pain.         Marland Kitchen. zinc sulfate 220 MG capsule   Oral   Take 220 mg by mouth daily.           Allergies Review of patient's allergies indicates no known allergies.  History reviewed. No pertinent family history.  Social History Social History  Substance Use Topics  . Smoking status: Never Smoker   . Smokeless tobacco: None  . Alcohol Use: No    Review of Systems Constitutional: No fever/chills Eyes: No visual changes. ENT: No sore throat. Cardiovascular: Denies chest pain. Respiratory: Denies shortness of breath. Gastrointestinal: No abdominal pain.  No nausea, no vomiting.  No diarrhea.  No constipation. Genitourinary: Negative for dysuria. Musculoskeletal: Negative for back pain. Skin: Negative for rash. Neurological: Negative for headaches, focal weakness or numbness.  10-point ROS otherwise negative.  ____________________________________________   PHYSICAL EXAM:  VITAL SIGNS: ED Triage Vitals  Enc Vitals Group     BP 05/11/15 1133 120/71 mmHg     Pulse Rate 05/11/15 1133 102     Resp 05/11/15 1133 20     Temp 05/11/15 1133 98.6 F (37 C)     Temp Source 05/11/15 1133 Oral  SpO2 05/11/15 1133 96 %     Weight 05/11/15 1133 160 lb (72.576 kg)     Height 05/11/15 1133  (1.676 m)      Head Cir --      Peak Flow --      Pain Score --      Pain Loc --      Pain Edu? --      Excl. in GC? --    Constitutional: Alert and oriented. Cranial appearing and in no acute distress. Patient laying on his left side, this is evidently his preferred position. He is interactive, he answers questions appropriately. Eyes: Conjunctivae are normal. PERRL. EOMI. Head: Atraumatic. Nose: No congestion/rhinnorhea. Mouth/Throat: Mucous membranes are slightly dry.  Oropharynx non-erythematous. Neck: No stridor.   Cardiovascular: Normal rate, regular rhythm. Grossly normal heart sounds.  Good peripheral circulation. Respiratory: Normal respiratory effort.  No retractions. Lungs CTAB. Patient does have occasional wet cough, no blood noted when he coughs. Family reports that he has a long history of chronic cough like this and this is not new. Gastrointestinal: Soft and nontender. No distention. No abdominal bruits. No CVA tenderness. G-tube site is clean dry and intact. Question if she may have some slight amount of dark secretions in his G-tube, but on Gastroccult testing this is negative. Patient rolled and skin examine, he has a large and deep sacral decubitus ulcer without erythema or purulence. He has a wound VAC in place, no evidence of superinfection. Rectal exam with brown formed stool, negative Hemoccult. Musculoskeletal: No lower extremity tenderness nor edema.  No joint effusions. Neurologic:  Normal speech and language. The patient has 4-5 strength of the right-sided extremities without noted deficit, he does have hemiplegia involving left-sided extremities which is chronic.  Skin:  Skin is warm, dry and intact septum is noted. No rash noted. Psychiatric: Mood and affect are calm and appropriate. Speech is soft but behavioral is normal. ____________________________________________   LABS (all labs ordered are listed, but only abnormal results are displayed)  Labs Reviewed  COMPREHENSIVE  METABOLIC PANEL - Abnormal; Notable for the following:    Glucose, Bld 110 (*)    BUN 25 (*)    Creatinine, Ser 0.49 (*)    All other components within normal limits  CBC - Abnormal; Notable for the following:    MCV 75.1 (*)    MCH 23.5 (*)    MCHC 31.3 (*)    RDW 20.0 (*)    All other components within normal limits  URINALYSIS COMPLETEWITH MICROSCOPIC ([facility] ONLY) - Abnormal; Notable for the following:    Color, Urine YELLOW (*)    APPearance CLEAR (*)    Squamous Epithelial / LPF 0-5 (*)    All other components within normal limits  LIPASE, BLOOD  PROTIME-INR  PH, GASTRIC FLUID (GASTROCCULT CARD)  POC OCCULT BLOOD, ED  TYPE AND SCREEN  ABO/RH   ____________________________________________  EKG  Reviewed and interpreted by me EKG time 1135 Sinus tachycardia with heart rate 103 QTc 450 No acute ischemic changes, though some minimal T-wave inversion seen in V2 and V3 The patient denies any chest symptoms or abdominal pain/chest pain at this time. ____________________________________________  RADIOLOGY  DG Abd Portable 1V (Final result) Result time: 05/11/15 14:25:01   Final result by Rad Results In Interface (05/11/15 14:25:01)   Narrative:   CLINICAL DATA: Hematemesis today, left-sided paralysis  EXAM: PORTABLE ABDOMEN - 1 VIEW  COMPARISON: None.  FINDINGS: Supine radiograph demonstrates elevation of the right  diaphragm. Spinal stimulator noted. Stool retained throughout the colon. No abnormally dilated loops of bowel.  IMPRESSION: Evidence of constipation.    ____________________________________________   PROCEDURES  Procedure(s) performed: None  Critical Care performed: No  ____________________________________________   INITIAL IMPRESSION / ASSESSMENT AND PLAN / ED COURSE  Pertinent labs & imaging results that were available during my care of the patient were reviewed by me and considered in my medical decision making (see chart for  details).  Patient presents for concerns of vomiting twice, possibly with bloody emesis. In the ER, it appears that he is at his baseline he has not had any more emesis and is not complaining abdominal pain with a reassuring exam. His gastric and Hemoccult negative at this time.  ----------------------------------------- 2:11 PM on 05/11/2015 -----------------------------------------  Thus far, laboratory evaluation and reexamined reassuring. Patient is resting comfortably, his pain is improved. He has not had a further emesis. We will reattempt obtaining urinalysis, I will give him some white IV hydration, and we await KUB results to evaluate for possibility of obstructive abnormality though on exam of patient does not have any distention, and no abdominal pain at this time.  ----------------------------------------- 2:47 PM on 05/11/2015 -----------------------------------------  Patient's Gastroccult specimen is positive for blood, called and discussed with the lab. Based on the report of hematemesis, Gastroccult positive and discussed with the patient and his family, they would like for the patient to stay for observation, and I think this is not unreasonable. We will admit the patient for serial exams and hemoglobin monitoring. He is hemodynamically stable, though his blood pressures are soft at this time in the 100s lower systolic I will give him some additional IV fluids and start PPI. ____________________________________________   FINAL CLINICAL IMPRESSION(S) / ED DIAGNOSES  Final diagnoses:  Gastric bleed  Hematemesis without nausea      Sharyn Creamer, MD 05/11/15 1506

## 2015-05-11 NOTE — ED Notes (Signed)
Hemoccult negative.

## 2015-05-11 NOTE — ED Notes (Addendum)
Pt dry at this time. MD at bedside.

## 2015-05-12 LAB — CBC
HEMATOCRIT: 37.2 % — AB (ref 40.0–52.0)
HEMOGLOBIN: 11.9 g/dL — AB (ref 13.0–18.0)
MCH: 24.1 pg — ABNORMAL LOW (ref 26.0–34.0)
MCHC: 32.1 g/dL (ref 32.0–36.0)
MCV: 75.1 fL — ABNORMAL LOW (ref 80.0–100.0)
Platelets: 148 10*3/uL — ABNORMAL LOW (ref 150–440)
RBC: 4.95 MIL/uL (ref 4.40–5.90)
RDW: 19.5 % — ABNORMAL HIGH (ref 11.5–14.5)
WBC: 7.9 10*3/uL (ref 3.8–10.6)

## 2015-05-12 MED ORDER — LANSOPRAZOLE 30 MG PO TBDP: 30.0000 mg | ORAL_TABLET | Freq: Two times a day (BID) | ORAL | Status: AC

## 2015-05-12 NOTE — Discharge Summary (Signed)
[facility] [place] at [facility]   PATIENT NAME: [name] MooreOtis [name]    MR#:  [id number]  DATE OF BIRTH:  11/21/1930  DATE OF ADMISSION:  05/11/2015 ADMITTING PHYSICIAN: Milagros LollSrikar Sudini, MD  DATE OF DISCHARGE: 05/12/2015  PRIMARY CARE PHYSICIAN: Dr. at [facility]   ADMISSION DIAGNOSIS:  Gastric bleed [K92.2] Hematemesis without nausea [K92.0]  DISCHARGE DIAGNOSIS:  Active Problems:   GI bleed   Pressure ulcer   SECONDARY DIAGNOSIS:   Past Medical History  Diagnosis Date  . Stroke (HCC)   . Hypertension   . GERD (gastroesophageal reflux disease)   . Hemorrhoids   . Asthma   . BPH (benign prostatic hyperplasia)   . VP (ventriculoperitoneal) shunt status   . Sacral decubitus ulcer     HOSPITAL COURSE:   1. Upper GI bleed. Hemoglobin was 13.1 on presentation and came down to 11.9. He had no further vomiting here in the hospital. He was given IV fluid hydration so I think this is dilutional. Of note hemoglobin on 03/20/2015 was 9.1. I will try to feed the patient breakfast and lunch. If no further vomiting, can discharge back to facility. I will increase his PPI to twice a day. Admitting physician did not feel that he needed a endoscopy. Since hemoglobin is stable and no further vomiting, I will try to advance diet and see what happens. 2. History of stroke, VP shunt 3. Stage III sacral decubitus ulcer with wound VAC- follow-up as outpatient 4. Essential hypertension continue usual medications 5. Asthma respiratory status stable  DISCHARGE CONDITIONS:   Satisfactory  CONSULTS OBTAINED:  None  DRUG ALLERGIES:   Allergies  Allergen Reactions  . Atorvastatin     Other reaction(s): Other Liver function abnormality  . Prednisone     Other reaction(s): Other Other Reaction: tingling, weakness  . Silodosin Swelling    Swelling in throat  . Warfarin Sodium     Other reaction(s): Other Clinically likely amyloid angiopathy.  High risk  of fatal ICH. H/O ICH.  Antiplatelets and anticoagulation for DVT prophylaxis okay.  . Gabapentin Swelling  . Metoprolol Tartrate   . Orange Juice [Orange Oil] Other (See Comments)    Unknown reaction  . Pregabalin Swelling  . Flomax  [Tamsulosin Hcl]     Other reaction(s): Other Numbness in arm/ sweats  . Morphine And Related Swelling    Other reaction(s): Other Worsened asthma    DISCHARGE MEDICATIONS:   Current Discharge Medication List    CONTINUE these medications which have CHANGED   Details  lansoprazole (PREVACID SOLUTAB) 30 MG disintegrating tablet Take 1 tablet (30 mg total) by mouth 2 (two) times daily. Qty: 60 tablet, Refills: 0      CONTINUE these medications which have NOT CHANGED   Details  acetaminophen (TYLENOL) 500 MG tablet 1,000 mg by Per NG tube route every 8 (eight) hours.    amLODipine (NORVASC) 10 MG tablet 10 mg by Per NG tube route daily.    ascorbic acid (VITAMIN C) 500 MG/5ML syrup Place 500 mg into feeding tube 2 (two) times daily.    brimonidine (ALPHAGAN) 0.2 % ophthalmic solution Place 1 drop into both eyes 2 (two) times daily.    cholecalciferol (VITAMIN D) 1000 UNITS tablet Place 1,000 Units into feeding tube daily.     collagenase (SANTYL) ointment Apply 1 application topically 3 (three) times a week. Apply to ischial wound on Monday, Wednesday, and Friday, every day shift    ferrous sulfate 325 (65  FE) MG tablet Place 325 mg into feeding tube 2 (two) times daily.     fluticasone (FLONASE) 50 MCG/ACT nasal spray Place 1 spray into both nostrils daily.    hydrALAZINE (APRESOLINE) 25 MG tablet Place 25 mg into feeding tube 4 (four) times daily.     ipratropium-albuterol (DUONEB) 0.5-2.5 (3) MG/3ML SOLN Take 3 mLs by nebulization every 6 (six) hours as needed (shortness of breath or wheezing).    lamoTRIgine (LAMICTAL) 25 MG tablet Place 25 mg into feeding tube daily.     latanoprost (XALATAN) 0.005 % ophthalmic solution Place 1 drop  into both eyes daily.     lidocaine (XYLOCAINE) 5 % ointment Apply 1 application topically every Monday, Wednesday, and Friday. Apply to sacral wound every day shift for before wound vac change.    loperamide (IMODIUM A-D) 2 MG tablet Take 2 mg by mouth every 4 (four) hours as needed for diarrhea or loose stools.    loperamide (IMODIUM) 1 MG/5ML solution Take 5 mLs by mouth every 8 (eight) hours as needed for diarrhea or loose stools.     Menthol, Topical Analgesic, (BIOFREEZE) 4 % GEL Apply 1 application topically every 8 (eight) hours.    Multiple Vitamin (MULTIVITAMIN) LIQD 5 mLs by PEG Tube route daily.     potassium chloride 20 MEQ/15ML (10%) SOLN Place 20 mEq into feeding tube daily.     Probiotic Product (ALIGN) 4 MG CAPS Take 4 mg by mouth daily.    selenium 50 MCG TABS tablet Place 50 mcg into feeding tube daily.     traMADol (ULTRAM) 50 MG tablet Place 50 mg into feeding tube every 8 (eight) hours as needed for moderate pain or severe pain.     zinc sulfate 220 MG capsule Take 220 mg by mouth daily.      STOP taking these medications     guaiFENesin 200 MG tablet          DISCHARGE INSTRUCTIONS:   Follow-up doctor at [facility] one day  If you experience worsening of your admission symptoms, develop shortness of breath, life threatening emergency, suicidal or homicidal thoughts you must seek medical attention immediately by calling 911 or calling your MD immediately  if symptoms less severe.  You Must read complete instructions/literature along with all the possible adverse reactions/side effects for all the Medicines you take and that have been prescribed to you. Take any new Medicines after you have completely understood and accept all the possible adverse reactions/side effects.   Please note  You were cared for by a hospitalist during your hospital stay. If you have any questions about your discharge medications or the care you received while you were  in the hospital after you are discharged, you can call the unit and asked to speak with the hospitalist on call if the hospitalist that took care of you is not available. Once you are discharged, your primary care physician will handle any further medical issues. Please note that NO REFILLS for any discharge medications will be authorized once you are discharged, as it is imperative that you return to your primary care physician (or establish a relationship with a primary care physician if you do not have one) for your aftercare needs so that they can reassess your need for medications and monitor your lab values.    Today   CHIEF COMPLAINT:   Chief Complaint  Patient presents with  . GI Bleeding    HISTORY OF PRESENT ILLNESS:  [name] [name]  is a 81 y.o. male with presented with vomiting blood.   VITAL SIGNS:  Blood pressure 135/82, pulse 105, temperature 98.7 F (37.1 C), temperature source Oral, resp. rate 18, height  (1.676 m), weight 66.543 kg (146 lb 11.2 oz), SpO2 92 %.    PHYSICAL EXAMINATION:  GENERAL:  81 y.o.-year-old patient lying in the bed with no acute distress.  EYES: Pupils equal, round, reactive to light and accommodation. No scleral icterus. Extraocular muscles intact.  HEENT: Head atraumatic, normocephalic. Oropharynx and nasopharynx clear.  NECK:  Supple, no jugular venous distention. No thyroid enlargement, no tenderness.  LUNGS: Normal breath sounds bilaterally, no wheezing, rales,rhonchi or crepitation. No use of accessory muscles of respiration.  CARDIOVASCULAR: S1, S2 normal. No murmurs, rubs, or gallops.  ABDOMEN: Soft, non-tender, non-distended. Bowel sounds present. No organomegaly or mass.  EXTREMITIES: No pedal edema, cyanosis, or clubbing.  NEUROLOGIC: alert. PSYCHIATRIC: The patient is alert.  SKIN:  wound VAC on stage III ulcer  DATA REVIEW:   CBC  Recent Labs Lab 05/12/15 0507  WBC 7.9  HGB 11.9*  HCT 37.2*  PLT 148*     Chemistries   Recent Labs Lab 05/11/15 1149  NA 140  K 4.4  CL 104  CO2 28  GLUCOSE 110*  BUN 25*  CREATININE 0.49*  CALCIUM 9.2  AST 26  ALT 18  ALKPHOS 94  BILITOT 0.6     Microbiology Results  Results for orders placed or performed during the hospital encounter of 05/11/15  MRSA PCR Screening     Status: Abnormal   Collection Time: 05/11/15  5:37 PM  Result Value Ref Range Status   MRSA by PCR POSITIVE (A) NEGATIVE Final    Comment:        The GeneXpert MRSA Assay (FDA approved for NASAL specimens only), is one component of a comprehensive MRSA colonization surveillance program. It is not intended to diagnose MRSA infection nor to guide or monitor treatment for MRSA infections. CRITICAL RESULT CALLED TO, READ BACK BY AND VERIFIED WITH: [facility] AT 2024 05/11/15.PMH     RADIOLOGY:  Dg Abd Portable 1v  05/11/2015  CLINICAL DATA:  Hematemesis today, left-sided paralysis EXAM: PORTABLE ABDOMEN - 1 VIEW COMPARISON:  None. FINDINGS: Supine radiograph demonstrates elevation of the right diaphragm. Spinal stimulator noted. Stool retained throughout the colon. No abnormally dilated loops of bowel. IMPRESSION: Evidence of constipation. Electronically Signed   By: Esperanza Heir M.D.   On: 05/11/2015 14:25    Management plans discussed with the patient, family and they are in agreement.  CODE STATUS:     Code Status Orders        Start     Ordered   05/11/15 1536  Full code   Continuous     05/11/15 1537    Advance Directive Documentation        Most Recent Value   Type of Advance Directive  Healthcare Power of Attorney   Pre-existing out of facility DNR order (yellow form or pink MOST form)     "MOST" Form in Place?        TOTAL TIME TAKING CARE OF THIS PATIENT: 35  minutes.    Alford Highland M.D on 05/12/2015 at 7:37 AM  Between 7am to 6pm - Pager - [phone]  After 6pm go to [email or web address] - password [email or web address] [facility]  [place]  [facility]  Office  [phone]  CC: Primary care physician; Doctor at [facility]

## 2015-05-12 NOTE — Consult Note (Signed)
WOC wound consult note Reason for Consult: Noted as Stage 3 pressure injury, present on admission.  NPWT (VAC) therapy in place.  Dressing was changed Sunday, will be due again Sunday.  Measurements are in the flow sheet.  Pending discharge today if tolerates PO intake.   Wound type: Stage 3 pressure injury to sacrum.  NPWT (VAC) in place.  Pressure Ulcer POA: Yes Measurement: per flowsheet and admission assessment 6 cm x 6.5 cm and left buttocks 2.5 cm x 2 cm both with NPWT in place.  Dressing changed yesterday 05/11/15 Wound bed: Flowsheet indicates wound is 100% red Drainage (amount, consistency, odor) Minimal serosanguinous drainage noted in canister.  Periwound:Intact Dressing procedure/placement/frequency:Cleanse wound with NS and pat gently dry.  Gently fill wound bed with granufoam.  Protect periwound skin between two wounds with drape for bridging.  Cover with drape and maintain seal at 125 mmHg.  Change 3x weekly.  Due again 05/13/15 WOC team will follow.  Maple HudsonKaren Razan Siler RN BSN CWON Pager [phone]

## 2015-05-12 NOTE — Discharge Instructions (Signed)
Gastrointestinal Bleeding °Gastrointestinal bleeding is bleeding somewhere along the path that food travels through the body (digestive tract). This path is anywhere between the mouth and the opening of the butt (anus). You may have blood in your throw up (vomit) or in your poop (stools). If there is a lot of bleeding, you may need to stay in the hospital. °HOME CARE °· Only take medicine as told by your doctor. °· Eat foods with fiber such as whole grains, fruits, and vegetables. You can also try eating 1 to 3 prunes a day. °· Drink enough fluids to keep your pee (urine) clear or pale yellow. °GET HELP RIGHT AWAY IF:  °· Your bleeding gets worse. °· You feel dizzy, weak, or you pass out (faint). °· You have bad cramps in your back or belly (abdomen). °· You have large blood clumps (clots) in your poop. °· Your problems are getting worse. °MAKE SURE YOU:  °· Understand these instructions. °· Will watch your condition. °· Will get help right away if you are not doing well or get worse. °  °This information is not intended to replace advice given to you by your health care provider. Make sure you discuss any questions you have with your health care provider. °  °Document Released: 04/06/2008 Document Revised: 06/14/2012 Document Reviewed: 12/16/2014 °Elsevier Interactive Patient Education ©2016 Elsevier Inc. ° °

## 2015-05-12 NOTE — Progress Notes (Signed)
Report called to nurse at [facility]. EMS called for transport. Bo McclintockBrewer,Ronelle Smallman S, RN

## 2015-05-12 NOTE — Progress Notes (Signed)
Patient discharged to [facility] via EMS. [facility] facility. All questions answered. Bo McclintockBrewer,Axel Meas S, RN

## 2015-05-12 NOTE — Progress Notes (Signed)
Patient tolerated breakfast and lunch with no complications. MD notified. Bo McclintockBrewer,Arriel Victor S, RN

## 2015-05-14 ENCOUNTER — Emergency Department
Admission: EM | Admit: 2015-05-14 | Discharge: 2015-05-14 | Disposition: A | Discharge status: Home / Self Care | Payer: Medicare Other | Attending: Emergency Medicine | Admitting: Emergency Medicine

## 2015-05-14 ENCOUNTER — Emergency Department: Payer: Medicare Other

## 2015-05-14 DIAGNOSIS — Z431 Encounter for attention to gastrostomy: Secondary | ICD-10-CM | POA: Diagnosis not present

## 2015-05-14 DIAGNOSIS — I10 Essential (primary) hypertension: Secondary | ICD-10-CM | POA: Insufficient documentation

## 2015-05-14 DIAGNOSIS — Z79899 Other long term (current) drug therapy: Secondary | ICD-10-CM | POA: Diagnosis not present

## 2015-05-14 DIAGNOSIS — Z931 Gastrostomy status: Secondary | ICD-10-CM

## 2015-05-14 MED ORDER — DIATRIZOATE MEGLUMINE 30 % UR SOLN: Freq: Once | URETHRAL | Status: DC | PRN

## 2015-05-14 NOTE — Discharge Instructions (Signed)
Care of a Feeding Tube People who have trouble swallowing or cannot take food or medicine by mouth are sometimes given feeding tubes. A feeding tube can go into the nose and down to the stomach or through the skin in the abdomen and into the stomach or small bowel. Some of the names of these feeding tubes are gastrostomy tubes, PEG lines, nasogastric tubes, and gastrojejunostomy tubes.  SUPPLIES NEEDED TO CARE FOR THE TUBE SITE  Clean gloves.  Clean wash cloth, gauze pads, or soft paper towel.  Cotton swabs.  Skin barrier ointment or cream.  Soap and water.  Pre-cut foam pads or gauze (that go around the tube).  Tube tape. TUBE SITE CARE 1. Have all supplies ready and available. 2. Wash hands well. 3. Put on clean gloves. 4. Remove the soiled foam pad or gauze, if present, that is found under the tube stabilizer. Change the foam pad or gauze daily or when soiled or moist. 5. Check the skin around the tube site for redness, rash, swelling, drainage, or extra tissue growth. If you notice any of these, call your caregiver. 6. Moisten gauze and cotton swabs with water and soap. 7. Wipe the area closest to the tube (right near the stoma) with cotton swabs. Wipe the surrounding skin with moistened gauze. Rinse with water. 8. Dry the skin and stoma site with a dry gauze pad or soft paper towel. Do not use antibiotic ointments at the tube site. 9. If the skin is red, apply a skin barrier cream or ointment (such as petroleum jelly) in a circular motion, using a cotton swab. The cream or ointment will provide a moisture barrier for the skin and helps with wound healing. 10. Apply a new pre-cut foam pad or gauze around the tube. Secure it with tape around the edges. If no drainage is present, foam pads or gauze may be left off. 11. Use tape or an anchoring device to fasten the feeding tube to the skin for comfort or as directed. Rotate where you tape the tube to avoid skin damage from the  adhesive. 12. Position the person in a semi-upright position (30-45 degree angle). 13. Throw away used supplies. 14. Remove gloves. 15. Wash hands. SUPPLIES NEEDED TO FLUSH A FEEDING TUBE  Clean gloves.  60 mL syringe (that connects to the feeding tube).  Towel.  Water. FLUSHING A FEEDING TUBE  1. Have all supplies ready and available. 2. Wash hands well. 3. Put on clean gloves. 4. Draw up 30 mL of water in the syringe. 5. Kink the feeding tube while disconnecting it from the feeding-bag tubing or while removing the plug at the end of the tube. Kinking closes the tube and prevents secretions in the tube from spilling out. 6. Insert the tip of the syringe into the end of the feeding tube. Release the kink. Slowly inject the water. 7. If unable to inject the water, the person with the feeding tube should lay on his or her left side. The tip of the tube may be against the stomach wall, blocking fluid flow. Changing positions may move the tip away from the stomach wall. After repositioning, try injecting the water again. 8. After injecting the water, remove the syringe. 9. Always flush before giving the first medicine, between medicines, and after the final medicine before starting a feeding. This prevents medicines from clogging the tube. 10. Throw away used supplies. 11. Remove gloves. 12. Wash hands.   This information is not intended to replace   advice given to you by your health care provider. Make sure you discuss any questions you have with your health care provider.   Document Released: 06/28/2005 Document Revised: 06/14/2012 Document Reviewed: 02/10/2012 Elsevier Interactive Patient Education 2016 Elsevier Inc.   

## 2015-05-14 NOTE — ED Provider Notes (Signed)
[facility] Emergency Department Provider Note  ____________________________________________  Time seen: 4:05 AM  I have reviewed the triage vital signs and the nursing notes.   HISTORY  Chief Complaint Post-op Problem      HPI [name] [name] is a 81 y.o. male presents with history per EMS of a dislodged G-tube. Per EMS facility states that they were not able to flush the PEG tube without difficulty. Patient denies any pain no complaints at present    Past Medical History  Diagnosis Date  . Stroke (HCC)   . Hypertension   . GERD (gastroesophageal reflux disease)   . Hemorrhoids   . Asthma   . BPH (benign prostatic hyperplasia)   . VP (ventriculoperitoneal) shunt status   . Sacral decubitus ulcer     Patient Active Problem List   Diagnosis Date Noted  . GI bleed 05/11/2015  . Pressure ulcer 05/11/2015    No past surgical history on file.  Current Outpatient Rx  Name  Route  Sig  Dispense  Refill  . acetaminophen (TYLENOL) 500 MG tablet   Per NG tube   1,000 mg by Per NG tube route every 8 (eight) hours.         Marland Kitchen amLODipine (NORVASC) 10 MG tablet   Per NG tube   10 mg by Per NG tube route daily.         Marland Kitchen ascorbic acid (VITAMIN C) 500 MG/5ML syrup   Per Tube   Place 500 mg into feeding tube 2 (two) times daily.         . brimonidine (ALPHAGAN) 0.2 % ophthalmic solution   Both Eyes   Place 1 drop into both eyes 2 (two) times daily.         . cholecalciferol (VITAMIN D) 1000 UNITS tablet   Per Tube   Place 1,000 Units into feeding tube daily.          . collagenase (SANTYL) ointment   Topical   Apply 1 application topically 3 (three) times a week. Apply to ischial wound on Monday, Wednesday, and Friday, every day shift         . ferrous sulfate 325 (65 FE) MG tablet   Per Tube   Place 325 mg into feeding tube 2 (two) times daily.          . fluticasone (FLONASE) 50 MCG/ACT nasal spray   Each Nare   Place 1 spray  into both nostrils daily.         . hydrALAZINE (APRESOLINE) 25 MG tablet   Per Tube   Place 25 mg into feeding tube 4 (four) times daily.          Marland Kitchen ipratropium-albuterol (DUONEB) 0.5-2.5 (3) MG/3ML SOLN   Nebulization   Take 3 mLs by nebulization every 6 (six) hours as needed (shortness of breath or wheezing).         Marland Kitchen lamoTRIgine (LAMICTAL) 25 MG tablet   Per Tube   Place 25 mg into feeding tube daily.          . lansoprazole (PREVACID SOLUTAB) 30 MG disintegrating tablet   Oral   Take 1 tablet (30 mg total) by mouth 2 (two) times daily.   60 tablet   0   . latanoprost (XALATAN) 0.005 % ophthalmic solution   Both Eyes   Place 1 drop into both eyes daily.          Marland Kitchen lidocaine (XYLOCAINE) 5 % ointment   Topical  Apply 1 application topically every Monday, Wednesday, and Friday. Apply to sacral wound every day shift for before wound vac change.         . loperamide (IMODIUM A-D) 2 MG tablet   Oral   Take 2 mg by mouth every 4 (four) hours as needed for diarrhea or loose stools.         Marland Kitchen loperamide (IMODIUM) 1 MG/5ML solution   Oral   Take 5 mLs by mouth every 8 (eight) hours as needed for diarrhea or loose stools.          . Menthol, Topical Analgesic, (BIOFREEZE) 4 % GEL   Apply externally   Apply 1 application topically every 8 (eight) hours.         . Multiple Vitamin (MULTIVITAMIN) LIQD   PEG Tube   5 mLs by PEG Tube route daily.          . potassium chloride 20 MEQ/15ML (10%) SOLN   Per Tube   Place 20 mEq into feeding tube daily.          . Probiotic Product (ALIGN) 4 MG CAPS   Oral   Take 4 mg by mouth daily.         Marland Kitchen selenium 50 MCG TABS tablet   Per Tube   Place 50 mcg into feeding tube daily.          . traMADol (ULTRAM) 50 MG tablet   Per Tube   Place 50 mg into feeding tube every 8 (eight) hours as needed for moderate pain or severe pain.          Marland Kitchen zinc sulfate 220 MG capsule   Oral   Take 220 mg by mouth  daily.           Allergies Atorvastatin; Prednisone; Silodosin; Warfarin sodium; Gabapentin; Metoprolol tartrate; Orange juice; Pregabalin; Flomax ; and Morphine and related  Family History  Problem Relation Age of Onset  . CAD Mother     Social History Social History  Substance Use Topics  . Smoking status: Never Smoker   . Smokeless tobacco: Not on file  . Alcohol Use: No    Review of Systems  Constitutional: Negative for fever. Eyes: Negative for visual changes. ENT: Negative for sore throat. Cardiovascular: Negative for chest pain. Respiratory: Negative for shortness of breath. Gastrointestinal: Negative for abdominal pain, vomiting and diarrhea. Positive G-tube displacement Genitourinary: Negative for dysuria. Musculoskeletal: Negative for back pain. Skin: Negative for rash. Neurological: Negative for headaches, focal weakness or numbness.   10-point ROS otherwise negative.  ____________________________________________   PHYSICAL EXAM:  VITAL SIGNS: ED Triage Vitals  Enc Vitals Group     BP 05/14/15 0405 113/80 mmHg     Pulse Rate 05/14/15 0405 101     Resp 05/14/15 0405 18     Temp --      Temp src --      SpO2 05/14/15 0405 98 %     Weight 05/14/15 0405 140 lb (63.504 kg)     Height 05/14/15 0405  (1.702 m)     Head Cir --      Peak Flow --      Pain Score 05/14/15 0408 0     Pain Loc --      Pain Edu? --      Excl. in GC? --      Constitutional: Alert and oriented. Well appearing and in no distress. Eyes: Conjunctivae are normal. PERRL. Normal extraocular movements.  ENT   Head: Normocephalic and atraumatic.   Nose: No congestion/rhinnorhea.   Mouth/Throat: Mucous membranes are moist.   Neck: No stridor. Cardiovascular: Normal rate, regular rhythm. Normal and symmetric distal pulses are present in all extremities. No murmurs, rubs, or gallops. Respiratory: Normal respiratory effort without tachypnea nor retractions. Breath  sounds are clear and equal bilaterally. No wheezes/rales/rhonchi. Gastrointestinal: Soft and nontender. No distention. There is no CVA tenderness. G-tube flushed without difficulty and subsequently aspirated without difficulty. Skin:  Skin is warm, dry and intact. No rash noted.    RADIOLOGY     DG Abd 1 View (Final result) Result time: 05/14/15 04:18:42   Final result by Rad Results In Interface (05/14/15 04:18:42)   Narrative:   CLINICAL DATA: Possible dislodged PEG tube  EXAM: ABDOMEN - 1 VIEW  COMPARISON: 05/11/2015  FINDINGS: 50 mL Gastroview was injected through the percutaneous gastrostomy tube. The contrast flows into the small bowel lumen without evidence of leakage outside the bowel. A portion of the tubing is obscured by superimposition of a stimulating device.  IMPRESSION: Peg tube is satisfactorily positioned. Mild study limitations due to superimposed stimulating device.   Electronically Signed By: Ellery Plunkaniel R Mitchell M.D. On: 05/14/2015 04:18           INITIAL IMPRESSION / ASSESSMENT AND PLAN / ED COURSE  Pertinent labs & imaging results that were available during my care of the patient were reviewed by me and considered in my medical decision making (see chart for details).  Patient underwent a abdominal x-ray with Gastrografin for PEG-tube placement which confirmed that the PEG tube was in satisfactory position  ____________________________________________   FINAL CLINICAL IMPRESSION(S) / ED DIAGNOSES  Final diagnoses:  G tube feedings (HCC)      Darci Currentandolph N Makaio Mach, MD 05/14/15 [phone]

## 2015-05-14 NOTE — ED Notes (Signed)
Patient's son and daughter to bedside. They are angered that the patient was taken out of bed "for nothing." Explained to family the reason he was sent over and that it is often better to err on the side of caution and that if the staff was worried about placement then they should have in fact sent him over for verification. MD in room to reiterate same conversation.

## 2015-05-22 ENCOUNTER — Ambulatory Visit: Payer: Medicare Other | Admitting: Surgery

## 2015-05-26 ENCOUNTER — Encounter: Payer: Medicare Other | Attending: Surgery | Admitting: Surgery

## 2015-05-26 DIAGNOSIS — E441 Mild protein-calorie malnutrition: Secondary | ICD-10-CM | POA: Diagnosis not present

## 2015-05-26 DIAGNOSIS — Z8673 Personal history of transient ischemic attack (TIA), and cerebral infarction without residual deficits: Secondary | ICD-10-CM | POA: Insufficient documentation

## 2015-05-26 DIAGNOSIS — L89153 Pressure ulcer of sacral region, stage 3: Secondary | ICD-10-CM | POA: Diagnosis not present

## 2015-05-26 DIAGNOSIS — G8190 Hemiplegia, unspecified affecting unspecified side: Secondary | ICD-10-CM | POA: Insufficient documentation

## 2015-05-27 ENCOUNTER — Ambulatory Visit: Payer: Medicare Other | Admitting: Surgery

## 2015-05-27 NOTE — Progress Notes (Signed)
[name] [name] ([id number]) Visit Report for 05/26/2015 Chief Complaint Document Details Patient Name: [name] [name] Date of Service: 05/26/2015 12:45 PM Medical Record Number: [id number] Patient Account Number: [id number] Date of Birth/Sex: 1930/12/08 (81 y.o. Male) Treating RN: [name] [name] Primary Care Physician: PATIENT, NO Other Clinician: Referring Physician: Audree [name] Treating Physician/Extender: [name] [name] in Treatment: [name] Information Obtained from: Caregiver Chief Complaint Patient is at the clinic for treatment of an open pressure ulcer. The patient comes along with his daughter who is the caregiver and lives in an assisted living facility and has had a ulcerative area on the sacrum for the last 6 months. Electronic Signature(s) Signed: 05/26/2015 1:02:22 PM By: [name] Kanner MD, FACS Entered By: [name] [name] on 05/26/2015 13:02:22 [name] [name] ([id number]) -------------------------------------------------------------------------------- HPI Details Patient Name: [name] [name] Date of Service: 05/26/2015 12:45 PM Medical Record Number: [id number] Patient Account Number: [id number] Date of Birth/Sex: April 03, 1931 (81 y.o. Male) Treating RN: [name] [name] Primary Care Physician: PATIENT, NO Other Clinician: Referring Physician: Audree [name] Treating Physician/Extender: [name] [name] in Treatment: [name] History of Present Illness Location: pressure injury to the sacral region and the left low back area. Quality: Patient reports experiencing a dull pain to affected area(s). Severity: Patient states wound are getting worse. Duration: Patient has had the wound for > 3 months prior to seeking treatment at the wound center Timing: Pain in wound is Intermittent (comes and goes Context: The wound appeared gradually over time Modifying Factors: Consults to this date include: wound VAC applications and local care Associated Signs and Symptoms: Wound has significant periowound  erythema and localized edema HPI Description: 81 year old gentleman who had a stroke in February of this year and has been bedbound since then. Since March he has developed a pressure injury and ulceration and has been treated at assisted living with a wound VAC and other supportive care. His had a PEG tube in place and though he eats some food during the day he gets supplementary PEG tube feeds between 6 PM and 6 AM. Has not had diabetes mellitus and was pretty active before his illness. The patient was seen here in July and after that had a problem where he was readmitted to hospital at [facility] and at that time treated with a wound VAC and then discharged to a skilled nursing facility. His plastic surgery appointment was canceled and he has not yet seen them. the nursing home has been changing his wound VAC. His nutrition has improved and is getting adequate tube feeds supplements and vitamins. 05/06/2015 -- was file was reviewed at the [facility] Department of reconstructive surgery and found to be unfit for surgery and reconstruction. His daughter confirms that they have not given him an appointment again. 05/26/2015 - he has not been here for about 3 [name] and today when he came here his wound VAC was not applied. Electronic Signature(s) Signed: 05/26/2015 1:02:46 PM By: [name] Kanner MD, FACS Entered By: [name] [name] on 05/26/2015 13:02:45 [name] [name] ([id number]) -------------------------------------------------------------------------------- Physical Exam Details Patient Name: [name] [name] Date of Service: 05/26/2015 12:45 PM Medical Record Number: [id number] Patient Account Number: [id number] Date of Birth/Sex: March 07, 1931 (81 y.o. Male) Treating RN: [name] [name] Primary Care Physician: PATIENT, NO Other Clinician: Referring Physician: Audree [name] Treating Physician/Extender: [name] [name] in Treatment: [name] Constitutional . Pulse regular. Respirations normal and  unlabored. Afebrile. . Eyes Nonicteric. Reactive to light. Ears, Nose, Mouth, and Throat Lips, teeth, and gums WNL.Marland Kitchen Moist mucosa without lesions . Neck supple and nontender. No  palpable supraclavicular or cervical adenopathy. Normal sized without goiter. Respiratory WNL. No retractions.. Cardiovascular Pedal Pulses WNL. No clubbing, cyanosis or edema. Chest Breasts symmetical and no nipple discharge.. Breast tissue WNL, no masses, lumps, or tenderness.. Lymphatic No adneopathy. No adenopathy. No adenopathy. Musculoskeletal Adexa without tenderness or enlargement.. Digits and nails w/o clubbing, cyanosis, infection, petechiae, ischemia, or inflammatory conditions.. Integumentary (Hair, Skin) No suspicious lesions. No crepitus or fluctuance. No peri-wound warmth or erythema. No masses.Marland Kitchen. Psychiatric Judgement and insight Intact.. No evidence of depression, anxiety, or agitation.. Notes the wound is looking clean and is healthy granulation tissue and the right superior lateral wound is small and there is no undermining. Electronic Signature(s) Signed: 05/26/2015 1:03:14 PM By: [name] KannerBritto, Latese Dufault MD, FACS Entered By: [name] KannerBritto, Sonam Wandel on 05/26/2015 13:03:13 [name] [name] ([id number]) -------------------------------------------------------------------------------- Physician Orders Details Patient Name: [name] [name] Date of Service: 05/26/2015 12:45 PM Medical Record Number: [id number] Patient Account Number: [id number] Date of Birth/Sex: 08/09/1930 [age] y.o. Male) Treating RN: [name] CoventryWoody, Kim Primary Care Physician: PATIENT, NO Other Clinician: Referring Physician: Audree BaneKING, PETER Treating Physician/Extender: [name] ReBritto, Illana Nolting [name] [name] Verbal / Phone Orders: Yes Clinician: Huel CoventryWoody, Kim Read Back and Verified: Yes Diagnosis Coding Wound Cleansing Wound #2 Medial Sacrum o Clean wound with Normal Saline. Skin Barriers/Peri-Wound Care Wound #2 Medial Sacrum o Skin Prep Primary Wound  Dressing Wound #2 Medial Sacrum o [facility] Secondary Dressing Wound #2 Medial Sacrum o [facility] Dressing Change Frequency Wound #2 Medial Sacrum o Three times weekly Follow-up Appointments Wound #2 Medial Sacrum o Return Appointment in 2 [name]. Negative Pressure Wound Therapy Wound #2 Medial Sacrum o Wound VAC settings at 125/130 mmHg continuous pressure. Use BLACK/GREEN foam to wound cavity. Use WHITE foam to fill any tunnel/s and/or undermining. Change VAC dressing 3 X WEEK. Change canister as indicated when full. Nurse may titrate settings and frequency of dressing changes as clinically indicated. - To be applied by Skilled Nursing facility Electronic Signature(s) Signed: 05/26/2015 [name]:17:28 PM By: [name] KannerBritto, Ceonna Frazzini MD, FACS Signed: 05/26/2015 [name]:50:02 PM By: Elliot GurneyWoody, RN, BSN, Kim RN, BSN [name] [name] ([id number]) Entered By: Elliot GurneyWoody, RN, BSN, Kim on 05/26/2015 13:01:21 Spilde, Erwin ([id number]) -------------------------------------------------------------------------------- Problem List Details Patient Name: [name] [name] Date of Service: 05/26/2015 12:45 PM Medical Record Number: [id number] Patient Account Number: [id number] Date of Birth/Sex: 10/01/1930 [age] y.o. Male) Treating RN: [name] CoventryWoody, Kim Primary Care Physician: PATIENT, NO Other Clinician: Referring Physician: Audree BaneKING, PETER Treating Physician/Extender: [name] ReBritto, [place] [name] [name] Active Problems ICD-10 Encounter Code Description Active Date Diagnosis L89.153 Pressure ulcer of sacral region, stage 3 04/22/2015 Yes G81.90 Hemiplegia, unspecified affecting unspecified side 04/22/2015 Yes E44.1 Mild protein-calorie malnutrition 04/22/2015 Yes Inactive Problems Resolved Problems Electronic Signature(s) Signed: 05/26/2015 1:02:16 PM By: [name] KannerBritto, Jewel Venditto MD, FACS Entered By: [name] KannerBritto, Jamylah Marinaccio on 05/26/2015 13:02:16 Scheiderer, Karla  ([id number]) -------------------------------------------------------------------------------- Progress Note Details Patient Name: [name] [name] Date of Service: 05/26/2015 12:45 PM Medical Record Number: [id number] Patient Account Number: [id number] Date of Birth/Sex: 05/18/1931 [age] y.o. Male) Treating RN: [name] CoventryWoody, Kim Primary Care Physician: PATIENT, NO Other Clinician: Referring Physician: Audree BaneKING, PETER Treating Physician/Extender: [name] ReBritto, Xiao Graul [name] [name] Subjective Chief Complaint Information obtained from Caregiver Patient is at the clinic for treatment of an open pressure ulcer. The patient comes along with his daughter who is the caregiver and lives in an assisted living facility and has had a ulcerative area on the sacrum for the last 6 months. History of Present Illness (HPI) The following HPI elements were  documented for the patient's wound: Location: pressure injury to the sacral region and the left low back area. Quality: Patient reports experiencing a dull pain to affected area(s). Severity: Patient states wound are getting worse. Duration: Patient has had the wound for > 3 months prior to seeking treatment at the wound center Timing: Pain in wound is Intermittent (comes and goes Context: The wound appeared gradually over time Modifying Factors: Consults to this date include: wound VAC applications and local care Associated Signs and Symptoms: Wound has significant periowound erythema and localized edema 81 year old gentleman who had a stroke in February of this year and has been bedbound since then. Since March he has developed a pressure injury and ulceration and has been treated at assisted living with a wound VAC and other supportive care. His had a PEG tube in place and though he eats some food during the day he gets supplementary PEG tube feeds between 6 PM and 6 AM. Has not had diabetes mellitus and was pretty active before his illness. The patient was seen  here in July and after that had a problem where he was readmitted to hospital at [facility] and at that time treated with a wound VAC and then discharged to a skilled nursing facility. His plastic surgery appointment was canceled and he has not yet seen them. the nursing home has been changing his wound VAC. His nutrition has improved and is getting adequate tube feeds supplements and vitamins. 05/06/2015 -- was file was reviewed at the [facility] Department of reconstructive surgery and found to be unfit for surgery and reconstruction. His daughter confirms that they have not given him an appointment again. 05/26/2015 - he has not been here for about 3 [name] and today when he came here his wound VAC was not applied. [name] [name] ([id number]) Objective Constitutional Pulse regular. Respirations normal and unlabored. Afebrile. Vitals Time Taken: 12:48 PM, Temperature: 98.0 F, Pulse: 105 bpm, Respiratory Rate: 18 breaths/min, Blood Pressure: 111/64 mmHg. Eyes Nonicteric. Reactive to light. Ears, Nose, Mouth, and Throat Lips, teeth, and gums WNL.Marland Kitchen Moist mucosa without lesions . Neck supple and nontender. No palpable supraclavicular or cervical adenopathy. Normal sized without goiter. Respiratory WNL. No retractions.. Cardiovascular Pedal Pulses WNL. No clubbing, cyanosis or edema. Chest Breasts symmetical and no nipple discharge.. Breast tissue WNL, no masses, lumps, or tenderness.. Lymphatic No adneopathy. No adenopathy. No adenopathy. Musculoskeletal Adexa without tenderness or enlargement.. Digits and nails w/o clubbing, cyanosis, infection, petechiae, ischemia, or inflammatory conditions.Marland Kitchen Psychiatric Judgement and insight Intact.. No evidence of depression, anxiety, or agitation.. General Notes: the wound is looking clean and is healthy granulation tissue and the right superior lateral wound is small and there is no undermining. Integumentary (Hair, Skin) No suspicious  lesions. No crepitus or fluctuance. No peri-wound warmth or erythema. No masses.. Wound #2 status is Open. Original cause of wound was Pressure Injury. The wound is located on the Medial Sacrum. The wound measures 10cm length x 5.8cm width x 0.2cm depth; 45.553cm^2 area and 9.111cm^3 volume. The wound is limited to skin breakdown. There is a large amount of serosanguineous [name] [name] ([id number]) drainage noted. The wound margin is distinct with the outline attached to the wound base. There is large (67-100%) red granulation within the wound bed. There is no necrotic tissue within the wound bed. The periwound skin appearance exhibited: Moist. The periwound skin appearance did not exhibit: Callus, Crepitus, Excoriation, Fluctuance, Friable, Induration, Localized Edema, Rash, Scarring, Dry/Scaly, Maceration, Atrophie Blanche, Cyanosis, Ecchymosis, Hemosiderin Staining,  Mottled, Pallor, Rubor, Erythema. Periwound temperature was noted as No Abnormality. Assessment Active Problems ICD-10 L89.153 - Pressure ulcer of sacral region, stage 3 G81.90 - Hemiplegia, unspecified affecting unspecified side E44.1 - Mild protein-calorie malnutrition As discussed with the daughter who is at the bedside we will continue with application of the wound VAC, and all other supportive care, local care including offloading. We will also continue with improving his nutrition and multivitamins and see him back on a regular basis. Plan Wound Cleansing: Wound #2 Medial Sacrum: Clean wound with Normal Saline. Skin Barriers/Peri-Wound Care: Wound #2 Medial Sacrum: Skin Prep Primary Wound Dressing: Wound #2 Medial Sacrum: Aquacel Ag - at [facility]. Secondary Dressing: Wound #2 Medial Sacrum: Boardered Foam Dressing - At [facility] Dressing Change Frequency: Wound #2 Medial Sacrum: Three times weekly Follow-up Appointments: Wound #2 Medial Sacrum: Return Appointment in 2 [name]. Negative Pressure Wound Therapy: [name],  [name] ([id number]) Wound #2 Medial Sacrum: Wound VAC settings at 125/130 mmHg continuous pressure. Use BLACK/GREEN foam to wound cavity. Use WHITE foam to fill any tunnel/s and/or undermining. Change VAC dressing 3 X WEEK. Change canister as indicated when full. Nurse may titrate settings and frequency of dressing changes as clinically indicated. - To be applied by Skilled Nursing facility As discussed with the daughter who is at the bedside we will continue with application of the wound VAC, and all other supportive care, local care including offloading. We will also continue with improving his nutrition and multivitamins and see him back on a regular basis. Electronic Signature(s) Signed: 05/26/2015 1:04:07 PM By: [name] Kanner MD, FACS Entered By: [name] [name] on 05/26/2015 13:04:07 [name] [name] ([id number]) -------------------------------------------------------------------------------- SuperBill Details Patient Name: [name] [name] Date of Service: 05/26/2015 Medical Record Number: [id number] Patient Account Number: [id number] Date of Birth/Sex: [birth year] (81 y.o. Male) Treating RN: [name] [name] Primary Care Physician: PATIENT, NO Other Clinician: Referring Physician: Audree [name] Treating Physician/Extender: [name] [name] in Treatment: [name] Diagnosis Coding ICD-10 Codes Code Description L89.153 Pressure ulcer of sacral region, stage 3 G81.90 Hemiplegia, unspecified affecting unspecified side E44.1 Mild protein-calorie malnutrition Facility Procedures CPT4 Code: [id number] Description: 99213 - WOUND CARE VISIT-LEV 3 EST PT Modifier: Quantity: 1 Physician Procedures CPT4 Code: [id number] Description: 99213 - WC PHYS LEVEL 3 - EST PT ICD-10 Description Diagnosis L89.153 Pressure ulcer of sacral region, stage 3 G81.90 Hemiplegia, unspecified affecting unspecified s E44.1 Mild protein-calorie malnutrition Modifier: ide Quantity: 1 Electronic Signature(s) Signed: 05/26/2015 [name]:17:28  PM By: [name] Kanner MD, FACS Signed: 05/26/2015 [name]:50:02 PM By: Elliot Gurney RN, BSN, Kim RN, BSN Previous Signature: 05/26/2015 1:04:19 PM Version By: [name] Kanner MD, FACS Entered By: Elliot Gurney RN, BSN, Kim on 05/26/2015 13:04:50

## 2015-05-27 NOTE — Progress Notes (Signed)
Hink, Ercel ([id number]) Visit Report for 05/26/2015 Arrival Information Details Patient Name: [name] [name], [name] [name] Date of Service: 05/26/2015 12:45 PM Medical Record Number: [id number] Patient Account Number: [id number] Date of Birth/Sex: 22-Sep-1930 (81 y.o. Male) Treating RN: Huel Coventry Primary Care Physician: PATIENT, NO Other Clinician: Referring Physician: Audree Bane Treating Physician/Extender: Rudene Re in Treatment: 4 Visit Information History Since Last Visit Added or deleted any medications: No Patient Arrived: Stretcher Any new allergies or adverse No Arrival Time: 12:47 reactions: Accompanied By: EMS, Had a fall or experienced change in No daughter activities of daily living that may Transfer Assistance: None affect Patient Identification Verified: Yes risk of falls: Secondary Verification Process Yes Signs or symptoms of abuse/neglect No Completed: since last visito Patient Requires Transmission-Based No Hospitalized since last visit: No Precautions: Pain Present Now: Unable to Patient Has Alerts: No Respond Electronic Signature(s) Signed: 05/26/2015 4:50:02 PM By: Elliot Gurney, RN, BSN, Kim RN, BSN Entered By: Elliot Gurney, RN, BSN, Kim on 05/26/2015 12:47:46 [name] [name], [name] [name] ([id number]) -------------------------------------------------------------------------------- Clinic Level of Care Assessment Details Patient Name: [name] [name], [name] [name] Date of Service: 05/26/2015 12:45 PM Medical Record Number: [id number] Patient Account Number: [id number] Date of Birth/Sex: 16-Dec-1930 (82 y.o. Male) Treating RN: Huel Coventry Primary Care Physician: PATIENT, NO Other Clinician: Referring Physician: Audree Bane Treating Physician/Extender: Rudene Re in Treatment: 4 Clinic Level of Care Assessment Items TOOL 4 Quantity Score  - Use when only an EandM is performed on FOLLOW-UP visit 0 ASSESSMENTS - Nursing Assessment / Reassessment X - Reassessment of Co-morbidities  (includes updates in patient status) 1 10 X - Reassessment of Adherence to Treatment Plan 1 5 ASSESSMENTS - [name] [name] and Skin Assessment / Reassessment X - Simple [name] [name] Assessment / Reassessment - one [name] [name] 1 5  - Complex [name] [name] Assessment / Reassessment - multiple wounds 0  - Dermatologic / Skin Assessment (not related to [name] [name] area) 0 ASSESSMENTS - Focused Assessment  - Circumferential Edema Measurements - multi extremities 0  - Nutritional Assessment / Counseling / Intervention 0  - Lower Extremity Assessment (monofilament, tuning fork, pulses) 0  - Peripheral Arterial Disease Assessment (using hand held doppler) 0 ASSESSMENTS - Ostomy and/or Continence Assessment and Care  - Incontinence Assessment and Management 0  - Ostomy Care Assessment and Management (repouching, etc.) 0 PROCESS - Coordination of Care X - Simple Patient / Family Education for ongoing care 1 15  - Complex (extensive) Patient / Family Education for ongoing care 0 X - Staff obtains Chiropractor, Records, Test Results / Process Orders 1 10  - Staff telephones HHA, Nursing Homes / Clarify orders / etc 0  - Routine Transfer to another Facility (non-emergent condition) 0 [name] [name], [name] [name] ([id number])  - Routine Hospital Admission (non-emergent condition) 0  - New Admissions / Manufacturing engineer / Ordering NPWT, Apligraf, etc. 0  - Emergency Hospital Admission (emergent condition) 0 X - Simple Discharge Coordination 1 10  - Complex (extensive) Discharge Coordination 0 PROCESS - Special Needs  - Pediatric / Minor Patient Management 0  - Isolation Patient Management 0  - Hearing / Language / Visual special needs 0  - Assessment of Community assistance (transportation, D/C planning, etc.) 0  - Additional assistance / Altered mentation 0  - Support Surface(s) Assessment (bed, cushion, seat, etc.) 0 INTERVENTIONS - [name] [name] Cleansing / Measurement X - Simple [name] [name] Cleansing - one [name] [name] 1  5  - Complex [name] [name] Cleansing - multiple wounds 0 X - [name] [name] Imaging (photographs - any number of wounds) 1 5  - [name] [name] Tracing (instead  of photographs) 0 X - Simple [name] [name] Measurement - one [name] [name] 1 5 []  - Complex [name] [name] Measurement - multiple wounds 0 INTERVENTIONS - [name] [name] Dressings X - Small [name] [name] Dressing one or multiple wounds 1 10 []  - Medium [name] [name] Dressing one or multiple wounds 0 []  - Large [name] [name] Dressing one or multiple wounds 0 []  - Application of Medications - topical 0 []  - Application of Medications - injection 0 INTERVENTIONS - Miscellaneous []  - External ear exam 0 Simmers, Gamal ([id number]) []  - Specimen Collection (cultures, biopsies, blood, body fluids, etc.) 0 []  - Specimen(s) / Culture(s) sent or taken to Lab for analysis 0 []  - Patient Transfer (multiple staff / Nurse, adultHoyer Lift / Similar devices) 0 []  - Simple Staple / Suture removal (25 or less) 0 []  - Complex Staple / Suture removal (26 or more) 0 []  - Hypo / Hyperglycemic Management (close monitor of Blood Glucose) 0 []  - Ankle / Brachial Index (ABI) - do not check if billed separately 0 X - Vital Signs 1 5 Has the patient been seen at the hospital within the last three years: Yes Total Score: 85 Level Of Care: New/Established - Level 3 Electronic Signature(s) Signed: 05/26/2015 4:50:02 PM By: Elliot GurneyWoody, RN, BSN, Kim RN, BSN Entered By: Elliot GurneyWoody, RN, BSN, Kim on 05/26/2015 13:04:39 [place], [name] [name] ([id number]) -------------------------------------------------------------------------------- Encounter Discharge Information Details Patient Name: [name] [name], [name] [name] Date of Service: 05/26/2015 12:45 PM Medical Record Number: [id number] Patient Account Number: [id number] Date of Birth/Sex: 08/02/1930 38(81 y.o. Male) Treating RN: Huel CoventryWoody, Kim Primary Care Physician: PATIENT, NO Other Clinician: Referring Physician: Audree BaneKING, PETER Treating Physician/Extender: Rudene ReBritto, Errol Weeks in Treatment: 4 Encounter Discharge Information  Items Schedule Follow-up Appointment: No Medication Reconciliation completed and provided to Patient/Care No Juelle Dickmann: Provided on Clinical Summary of Care: 05/26/2015 Form Type Recipient Paper Patient OS Electronic Signature(s) Signed: 05/26/2015 1:05:23 PM By: Gwenlyn PerkingMoore, Shelia Entered By: Gwenlyn PerkingMoore, Shelia on 05/26/2015 13:05:23 [name] [name], Shadman ([id number]) -------------------------------------------------------------------------------- Multi [name] [name] Chart Details Patient Name: [name] [name], [name] [name] Date of Service: 05/26/2015 12:45 PM Medical Record Number: [id number] Patient Account Number: [id number] Date of Birth/Sex: 03/20/1931 14(81 y.o. Male) Treating RN: Huel CoventryWoody, Kim Primary Care Physician: PATIENT, NO Other Clinician: Referring Physician: Audree BaneKING, PETER Treating Physician/Extender: Rudene ReBritto, Errol Weeks in Treatment: 4 Vital Signs Height(in): Pulse(bpm): 105 Weight(lbs): Blood Pressure 111/64 (mmHg): Body Mass Index(BMI): Temperature(F): 98.0 Respiratory Rate 18 (breaths/min): Photos: [2:No Photos] [N/A:N/A] [name] [name] Location: [2:Sacrum - Medial] [N/A:N/A] Wounding Event: [2:Pressure Injury] [N/A:N/A] Primary Etiology: [2:Pressure Ulcer] [N/A:N/A] Comorbid History: [2:Cataracts, Glaucoma, Anemia, Hypertension, Osteoarthritis] [N/A:N/A] Date Acquired: [2:12/09/2014] [N/A:N/A] Weeks of Treatment: [2:4] [N/A:N/A] [name] [name] Status: [2:Open] [N/A:N/A] Measurements L x W x D 10x5.8x0.2 [N/A:N/A] (cm) Area (cm) : [2:45.553] [N/A:N/A] Volume (cm) : [2:9.111] [N/A:N/A] % Reduction in Area: [2:14.00%] [N/A:N/A] % Reduction in Volume: 14.00% [N/A:N/A] 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] [N/A:N/A] Exudate Amount: [2:Large] [N/A:N/A] Exudate Type: [2:Serosanguineous] [N/A:N/A] Exudate Color: [2:red, brown] [N/A:N/A] Foul Odor After [2:Yes] [N/A:N/A] Cleansing: Odor Anticipated Due to No [N/A:N/A] Product Use: [name] [name] Margin: [2:Distinct, outline attached] [N/A:N/A] Granulation Amount:  [2:Large (67-100%)] [N/A:N/A] Granulation Quality: [2:Red] [N/A:N/A] Necrotic Amount: [2:None Present (0%)] [N/A:N/A] Exposed Structures: [2:Fascia: No Fat: No] [N/A:N/A] Tendon: No Muscle: No Joint: No Bone: No Limited to Skin Breakdown Epithelialization: None N/A N/A Periwound Skin Texture: Edema: No N/A N/A Excoriation: No Induration: No Callus: No Crepitus: No Fluctuance: No Friable: No Rash: No Scarring: No Periwound Skin Moist: Yes N/A N/A Moisture: Maceration: No Dry/Scaly: No Periwound Skin Color: Atrophie Blanche: No N/A N/A Cyanosis: No Ecchymosis: No Erythema: No Hemosiderin Staining: No  Mottled: No Pallor: No Rubor: No Temperature: No Abnormality N/A N/A Tenderness on No N/A N/A Palpation: [name] [name] Preparation: Ulcer Cleansing: N/A N/A Rinsed/Irrigated with Saline Topical Anesthetic Applied: None Treatment Notes Electronic Signature(s) Signed: 05/26/2015 4:50:02 PM By: Elliot Gurney, RN, BSN, Kim RN, BSN Entered By: Elliot Gurney, RN, BSN, Kim on 05/26/2015 13:01:03 [name] [name], [name] [name] ([id number]) -------------------------------------------------------------------------------- Multi-Disciplinary Care Plan Details Patient Name: [name] [name], [name] [name] Date of Service: 05/26/2015 12:45 PM Medical Record Number: [id number] Patient Account Number: [id number] Date of Birth/Sex: [birth year] (81 y.o. Male) Treating RN: Huel Coventry Primary Care Physician: PATIENT, NO Other Clinician: Referring Physician: Audree Bane Treating Physician/Extender: Rudene Re in Treatment: 4 Active Inactive Orientation to the [name] Nursing Diagnoses: Knowledge deficit related to the [name] program Goals: Patient/caregiver will verbalize understanding of the [name] Date Initiated: 05/26/2015 Goal Status: Active Interventions: Provide education on orientation to the [name] [name] center Notes: [name] [name]/Skin Impairment Nursing Diagnoses: Impaired tissue  integrity Goals: Ulcer/skin breakdown will heal within 14 weeks Date Initiated: 05/26/2015 Goal Status: Active Interventions: Provide education on ulcer and skin care Notes: Electronic Signature(s) Signed: 05/26/2015 4:50:02 PM By: Elliot Gurney, RN, BSN, Kim RN, BSN Entered By: Elliot Gurney, RN, BSN, Kim on 05/26/2015 13:00:56 [name] [name], [name] [name] ([id number]) -------------------------------------------------------------------------------- Patient/Caregiver Education Details Patient Name: [name] [name], [name] [name] Date of Service: 05/26/2015 12:45 PM Medical Record Number: [id number] Patient Account Number: [id number] Date of Birth/Gender: Jul 24, 1930 (81 y.o. Male) Treating RN: Huel Coventry Primary Care Physician: PATIENT, NO Other Clinician: Referring Physician: Audree Bane Treating Physician/Extender: Rudene Re in Treatment: 4 Education Assessment Education Provided To: Caregiver Education Topics Provided [name] [name]/Skin Impairment: Handouts: Caring for Your Ulcer, Other: continue NPWT as prescribed Methods: Demonstration, Explain/Verbal Responses: State content correctly Electronic Signature(s) Signed: 05/26/2015 4:50:02 PM By: Elliot Gurney, RN, BSN, Kim RN, BSN Entered By: Elliot Gurney, RN, BSN, Kim on 05/26/2015 13:05:55 [name] [name], [name] [name] ([id number]) -------------------------------------------------------------------------------- [name] [name] Assessment Details Patient Name: [name] [name], [name] [name] Date of Service: 05/26/2015 12:45 PM Medical Record Number: [id number] Patient Account Number: [id number] Date of Birth/Sex: 1930/10/19 (81 y.o. Male) Treating RN: Huel Coventry Primary Care Physician: PATIENT, NO Other Clinician: Referring Physician: Audree Bane Treating Physician/Extender: Rudene Re in Treatment: 4 [name] [name] Status [name] [name] Number: 2 Primary Pressure Ulcer Etiology: [name] [name] Location: Sacrum - Medial [name] [name] Open Wounding Event: Pressure Injury Status: Date Acquired: 12/09/2014 Comorbid Cataracts, Glaucoma,  Anemia, Weeks Of Treatment: 4 History: Hypertension, Osteoarthritis Clustered [name] [name]: No Photos Photo Uploaded By: Elliot Gurney, RN, BSN, Kim on 05/26/2015 13:15:22 [name] [name] Measurements Length: (cm) 10 Width: (cm) 5.8 Depth: (cm) 0.2 Area: (cm) 45.553 Volume: (cm) 9.111 % Reduction in Area: 14% % Reduction in Volume: 14% Epithelialization: None [name] [name] Description Classification: Category/Stage IV [name] [name] Margin: Distinct, outline attached Exudate Amount: Large Exudate Type: Serosanguineous Exudate Color: red, brown [name] [name], [name] [name] ([id number]) Foul Odor After Cleansing: Yes Due to Product Use: No [name] [name] Bed Granulation Amount: Large (67-100%) Exposed Structure Granulation Quality: Red Fascia Exposed: No Necrotic Amount: None Present (0%) Fat Layer Exposed: No Tendon Exposed: No Muscle Exposed: No Joint Exposed: No Bone Exposed: No Limited to Skin Breakdown Periwound Skin Texture Texture Color No Abnormalities Noted: No No Abnormalities Noted: No Callus: No Atrophie Blanche: No Crepitus: No Cyanosis: No Excoriation: No Ecchymosis: No Fluctuance: No Erythema: No Friable: No Hemosiderin Staining: No Induration: No Mottled: No Localized Edema: No Pallor: No Rash: No Rubor: No Scarring: No Temperature / Pain Moisture Temperature: No Abnormality No Abnormalities Noted: No Dry / Scaly: No Maceration: No Moist: Yes [name] [name] Preparation Ulcer Cleansing: Rinsed/Irrigated with Saline Topical Anesthetic Applied: None  Treatment Notes [name] [name] #2 (Medial Sacrum) 1. Cleansed with: Clean [name] [name] with Normal Saline 2. Anesthetic Topical Lidocaine 4% cream to [name] [name] bed prior to debridement 4. Dressing Applied: Aquacel Ag 5. Secondary Dressing Applied Bordered Foam Dressing Electronic Signature(s) Signed: 05/26/2015 4:50:02 PM By: Elliot Gurney, RN, BSN, Kim RN, BSN Laplant, Aydin ([id number]) Entered By: Elliot Gurney, RN, BSN, Kim on 05/26/2015 12:53:33 Parlee, Jaquann  ([id number]) -------------------------------------------------------------------------------- Vitals Details Patient Name: Ishmael, Thorne Date of Service: 05/26/2015 12:45 PM Medical Record Number: [id number] Patient Account Number: [id number] Date of Birth/Sex: 04/29/1931 (81 y.o. Male) Treating RN: Huel Coventry Primary Care Physician: PATIENT, NO Other Clinician: Referring Physician: Audree Bane Treating Physician/Extender: Rudene Re in Treatment: 4 Vital Signs Time Taken: 12:48 Temperature (F): 98.0 Pulse (bpm): 105 Respiratory Rate (breaths/min): 18 Blood Pressure (mmHg): 111/64 Reference Range: 80 - 120 mg / dl Electronic Signature(s) Signed: 05/26/2015 4:50:02 PM By: Elliot Gurney, RN, BSN, Kim RN, BSN Entered By: Elliot Gurney, RN, BSN, Kim on 05/26/2015 12:48:22

## 2015-06-08 ENCOUNTER — Encounter: Payer: Self-pay | Admitting: Emergency Medicine

## 2015-06-08 ENCOUNTER — Emergency Department: Payer: Medicare Other

## 2015-06-08 ENCOUNTER — Emergency Department
Admission: EM | Admit: 2015-06-08 | Discharge: 2015-06-08 | Disposition: A | Discharge status: Home / Self Care | Payer: Medicare Other | Attending: Emergency Medicine | Admitting: Emergency Medicine

## 2015-06-08 DIAGNOSIS — I10 Essential (primary) hypertension: Secondary | ICD-10-CM | POA: Diagnosis not present

## 2015-06-08 DIAGNOSIS — F0390 Unspecified dementia without behavioral disturbance: Secondary | ICD-10-CM | POA: Insufficient documentation

## 2015-06-08 DIAGNOSIS — Z431 Encounter for attention to gastrostomy: Secondary | ICD-10-CM

## 2015-06-08 DIAGNOSIS — Z79899 Other long term (current) drug therapy: Secondary | ICD-10-CM | POA: Insufficient documentation

## 2015-06-08 DIAGNOSIS — K9423 Gastrostomy malfunction: Secondary | ICD-10-CM | POA: Insufficient documentation

## 2015-06-08 DIAGNOSIS — T85528A Displacement of other gastrointestinal prosthetic devices, implants and grafts, initial encounter: Secondary | ICD-10-CM

## 2015-06-08 MED ORDER — DIATRIZOATE MEGLUMINE 30 % UR SOLN
Freq: Once | URETHRAL | Status: AC | PRN
  Administered 2015-06-08: 60 mL

## 2015-06-08 NOTE — ED Notes (Signed)
Family member with no complaints at this time. Respirations even and unlabored. Skin warm/dry. Discharge instructions reviewed with daughter at this time. Daughter given opportunity to voice concerns/ask questions. Patient discharged at this time and left Emergency Department, via ambulance.

## 2015-06-08 NOTE — ED Notes (Signed)
Spoke with Marylene Landngela from [facility] on details on G-Tube size, 20 F.

## 2015-06-08 NOTE — ED Provider Notes (Signed)
[facility] Emergency Department Provider Note  ____________________________________________  Time seen: 8:30 PM  I have reviewed the triage vital signs and the nursing notes.   HISTORY  Chief Complaint GI Problem    HPI Deaken Jurgens is a 81 y.o. male sent to the ED from [facility] due to G-tube dislodgment. When they checked on the patient with her usual rounds at 7:30 PM, they noticed that the tube was out of place and lying on the bed next to the patient. They last seen him at 5:45 PM when giving him medications. No other abnormalities or acute symptoms noted. The patient is otherwise been in his usual baseline. Discussed this with the patient's daughter at the bedside who agrees.  He does have chronic debility due to strokes and dementia.   Past Medical History  Diagnosis Date  . Stroke (HCC)   . Hypertension   . GERD (gastroesophageal reflux disease)   . Hemorrhoids   . Asthma   . BPH (benign prostatic hyperplasia)   . VP (ventriculoperitoneal) shunt status   . Sacral decubitus ulcer      Patient Active Problem List   Diagnosis Date Noted  . GI bleed 05/11/2015  . Pressure ulcer 05/11/2015     History reviewed. No pertinent past surgical history.   Current Outpatient Rx  Name  Route  Sig  Dispense  Refill  . amLODipine (NORVASC) 10 MG tablet   Per NG tube   10 mg by Per NG tube route daily.         Marland Kitchen ascorbic acid (VITAMIN C) 500 MG/5ML syrup   Per Tube   Place 500 mg into feeding tube 2 (two) times daily.         . brimonidine (ALPHAGAN) 0.2 % ophthalmic solution   Both Eyes   Place 1 drop into both eyes 2 (two) times daily.         . cholecalciferol (VITAMIN D) 1000 UNITS tablet   Per Tube   Place 1,000 Units into feeding tube daily.          . collagenase (SANTYL) ointment   Topical   Apply 1 application topically 3 (three) times a week. Apply to ischial wound on Monday, Wednesday, and Friday, every day  shift         . ferrous sulfate 325 (65 FE) MG tablet   Per Tube   Place 325 mg into feeding tube 2 (two) times daily.          . fluticasone (FLONASE) 50 MCG/ACT nasal spray   Each Nare   Place 1 spray into both nostrils daily.         Marland Kitchen guaiFENesin (ROBITUSSIN) 100 MG/5ML SOLN   Oral   Take 20 mLs by mouth every 6 (six) hours as needed for cough or to loosen phlegm.         . hydrALAZINE (APRESOLINE) 25 MG tablet   Per Tube   Place 25 mg into feeding tube 4 (four) times daily.          Marland Kitchen ipratropium-albuterol (DUONEB) 0.5-2.5 (3) MG/3ML SOLN   Nebulization   Take 3 mLs by nebulization every 6 (six) hours as needed (shortness of breath or wheezing).         Marland Kitchen lamoTRIgine (LAMICTAL) 25 MG tablet   Per Tube   Place 25 mg into feeding tube daily.          . lansoprazole (PREVACID SOLUTAB) 30 MG disintegrating tablet  Oral   Take 1 tablet (30 mg total) by mouth 2 (two) times daily.   60 tablet   0   . latanoprost (XALATAN) 0.005 % ophthalmic solution   Both Eyes   Place 1 drop into both eyes daily.          Marland Kitchen lidocaine (XYLOCAINE) 5 % ointment   Topical   Apply 1 application topically every Monday, Wednesday, and Friday. Apply to sacral wound every day shift for before wound vac change.         . loperamide (IMODIUM A-D) 2 MG tablet   Oral   Take 2 mg by mouth every 4 (four) hours as needed for diarrhea or loose stools.         Marland Kitchen loperamide (IMODIUM) 1 MG/5ML solution   Oral   Take 5 mLs by mouth every 8 (eight) hours as needed for diarrhea or loose stools.          . Menthol, Topical Analgesic, (BIOFREEZE) 4 % GEL   Apply externally   Apply 1 application topically every 8 (eight) hours.         . Multiple Vitamin (MULTIVITAMIN) LIQD   PEG Tube   5 mLs by PEG Tube route daily.          . potassium chloride 20 MEQ/15ML (10%) SOLN   Per Tube   Place 20 mEq into feeding tube daily.          . Probiotic Product (ALIGN) 4 MG CAPS    Oral   Take 4 mg by mouth daily.         Marland Kitchen selenium 50 MCG TABS tablet   Per Tube   Place 50 mcg into feeding tube daily.          . sodium chloride (OCEAN) 0.65 % SOLN nasal spray   Each Nare   Place 2 sprays into both nostrils every 8 (eight) hours as needed for congestion.         . traMADol (ULTRAM) 50 MG tablet   Per Tube   Place 50 mg into feeding tube every 8 (eight) hours as needed for moderate pain or severe pain.          Marland Kitchen zinc sulfate 220 MG capsule   Oral   Take 220 mg by mouth daily.         Marland Kitchen acetaminophen (TYLENOL) 500 MG tablet   Per NG tube   1,000 mg by Per NG tube route every 8 (eight) hours.            Allergies Atorvastatin; Prednisone; Silodosin; Warfarin sodium; Gabapentin; Metoprolol tartrate; Orange juice; Pregabalin; Flomax ; and Morphine and related   Family History  Problem Relation Age of Onset  . CAD Mother     Social History Social History  Substance Use Topics  . Smoking status: Never Smoker   . Smokeless tobacco: None  . Alcohol Use: No    Review of Systems ROS Limited due to patient's dementia, but as obtained via family and nursing home representatives, no acute symptoms Constitutional:   No fever or chills. No weight changes Eyes:   No blurry vision or double vision.  ENT:   No sore throat. Cardiovascular:   No chest pain. Respiratory:   No dyspnea or cough. Gastrointestinal:   Negative for abdominal pain, vomiting and diarrhea.  No BRBPR or melena. Genitourinary:   Negative for dysuria, urinary retention, bloody urine, or difficulty urinating. Musculoskeletal:   Negative for back  pain. No joint swelling or pain. Skin:   Negative for rash. Neurological:   Negative for headaches, focal weakness or numbness. Psychiatric:  No anxiety or depression.   Endocrine:  No hot/cold intolerance, changes in energy, or sleep difficulty.  10-point ROS otherwise  negative.  ____________________________________________   PHYSICAL EXAM:  VITAL SIGNS: ED Triage Vitals  Enc Vitals Group     BP 06/08/15 2014 109/74 mmHg     Pulse Rate 06/08/15 2014 108     Resp 06/08/15 2014 20     Temp 06/08/15 2014 99.1 F (37.3 C)     Temp Source 06/08/15 2014 Oral     SpO2 06/08/15 2014 95 %     Weight 06/08/15 2014 144 lb 3 oz (65.403 kg)     Height 06/08/15 2014 6' (1.829 m)     Head Cir --      Peak Flow --      Pain Score --      Pain Loc --      Pain Edu? --      Excl. in GC? --      Constitutional:   Alert and confused, at baseline. Well appearing and in no distress. Eyes:   No scleral icterus. No conjunctival pallor. PERRL. EOMI ENT   Head:   Normocephalic and atraumatic.   Nose:   No congestion/rhinnorhea. No septal hematoma   Mouth/Throat:   MMM, no pharyngeal erythema. No peritonsillar mass. No uvula shift.   Neck:   No stridor. No SubQ emphysema. No meningismus. Hematological/Lymphatic/Immunilogical:   No cervical lymphadenopathy. Cardiovascular:   RRR. Normal and symmetric distal pulses are present in all extremities. No murmurs, rubs, or gallops. Respiratory:   Normal respiratory effort without tachypnea nor retractions. Breath sounds are clear and equal bilaterally. No wheezes/rales/rhonchi. Gastrointestinal:   Soft and nontender. No distention. There is no CVA tenderness.  No rebound, rigidity, or guarding. Gastrostomy site in the supraumbilical location is clean without any discharge or drainage or bleeding. I am able to easily probe through the tract into the stomach with gentle pressure from a cotton swab back end Genitourinary:   deferred Musculoskeletal:   Nontender with normal range of motion in all extremities. No joint effusions.  No lower extremity tenderness.  No edema. Wound VAC on the patient's backside is disconnected on patient arrival. No VAC in place, wound VAC catheter is not capped and is open. No drainage  from the area. Neurologic:   Normal speech and language.  CN 2-10 normal. Motor grossly intact. No pronator drift.  Normal gait. No gross focal neurologic deficits are appreciated.  Skin:    Skin is warm, dry and intact. No rash noted.  No petechiae, purpura, or bullae. Psychiatric:   Mood and affect are normal. Speech and behavior are normal. Patient exhibits appropriate insight and judgment.  ____________________________________________    LABS (pertinent positives/negatives) (all labs ordered are listed, but only abnormal results are displayed) Labs Reviewed - No data to display ____________________________________________   EKG    ____________________________________________    RADIOLOGY  Follow-up KUB after placement reveals satisfactory position without leak  ____________________________________________   PROCEDURES Gastrostomy tube replacement by me Patient normally has a 20 [place] tube. We have 3518 [place] and 22 French tubes available here in the emergency department, so an 1318 French tube was placed especially since the tract appears to have narrowed slightly. After cleansing around the area with chlorhexidine, using a small amount of lubricant I was able to easily  pass the G-tube into the stomach. The balloon was inflated with 20 mL of air. I then gently retracted the tube until it felt stuck. I then advance the rubber bumper to the abdominal wall on the outside. The area was secured. A follow-up x-ray was obtained which showed satisfactory position  ____________________________________________   INITIAL IMPRESSION / ASSESSMENT AND PLAN / ED COURSE  Pertinent labs & imaging results that were available during my care of the patient were reviewed by me and considered in my medical decision making (see chart for details).  Patient sent to the ED due to his G-tube being dislodged. This was immediately replaced due to concern for his stoma closing up causing inability  to provide nutrition without further invasive procedures. He tolerated replacement well, he is medically stable in good condition, discharge home follow up with primary care.     ____________________________________________   FINAL CLINICAL IMPRESSION(S) / ED DIAGNOSES  Final diagnoses:  Dislodged gastrostomy tube (HCC)   chronic dementia    Sharman Cheek, MD 06/08/15 2247

## 2015-06-08 NOTE — Discharge Instructions (Signed)
Care of a Feeding Tube People who have trouble swallowing or cannot take food or medicine by mouth are sometimes given feeding tubes. A feeding tube can go into the nose and down to the stomach or through the skin in the abdomen and into the stomach or small bowel. Some of the names of these feeding tubes are gastrostomy tubes, PEG lines, nasogastric tubes, and gastrojejunostomy tubes.  SUPPLIES NEEDED TO CARE FOR THE TUBE SITE  Clean gloves.  Clean wash cloth, gauze pads, or soft paper towel.  Cotton swabs.  Skin barrier ointment or cream.  Soap and water.  Pre-cut foam pads or gauze (that go around the tube).  Tube tape. TUBE SITE CARE  Have all supplies ready and available.  Wash hands well.  Put on clean gloves.  Remove the soiled foam pad or gauze, if present, that is found under the tube stabilizer. Change the foam pad or gauze daily or when soiled or moist.  Check the skin around the tube site for redness, rash, swelling, drainage, or extra tissue growth. If you notice any of these, call your caregiver.  Moisten gauze and cotton swabs with water and soap.  Wipe the area closest to the tube (right near the stoma) with cotton swabs. Wipe the surrounding skin with moistened gauze. Rinse with water.  Dry the skin and stoma site with a dry gauze pad or soft paper towel. Do not use antibiotic ointments at the tube site.  If the skin is red, apply a skin barrier cream or ointment (such as petroleum jelly) in a circular motion, using a cotton swab. The cream or ointment will provide a moisture barrier for the skin and helps with wound healing.  Apply a new pre-cut foam pad or gauze around the tube. Secure it with tape around the edges. If no drainage is present, foam pads or gauze may be left off.  Use tape or an anchoring device to fasten the feeding tube to the skin for comfort or as directed. Rotate where you tape the tube to avoid skin damage from the adhesive.  Position  the person in a semi-upright position (30-45 degree angle).  Throw away used supplies.  Remove gloves.  Wash hands. SUPPLIES NEEDED TO FLUSH A FEEDING TUBE  Clean gloves.  60 mL syringe (that connects to the feeding tube).  Towel.  Water. FLUSHING A FEEDING TUBE   Have all supplies ready and available.  Wash hands well.  Put on clean gloves.  Draw up 30 mL of water in the syringe.  Kink the feeding tube while disconnecting it from the feeding-bag tubing or while removing the plug at the end of the tube. Kinking closes the tube and prevents secretions in the tube from spilling out.  Insert the tip of the syringe into the end of the feeding tube. Release the kink. Slowly inject the water.  If unable to inject the water, the person with the feeding tube should lay on his or her left side. The tip of the tube may be against the stomach wall, blocking fluid flow. Changing positions may move the tip away from the stomach wall. After repositioning, try injecting the water again.  After injecting the water, remove the syringe.  Always flush before giving the first medicine, between medicines, and after the final medicine before starting a feeding. This prevents medicines from clogging the tube.  Throw away used supplies.  Remove gloves.  Wash hands.   This information is not intended to replace  advice given to you by your health care provider. Make sure you discuss any questions you have with your health care provider.   Document Released: 06/28/2005 Document Revised: 06/14/2012 Document Reviewed: 02/10/2012 Elsevier Interactive Patient Education 2016 [employer].  Care of a Feeding Tube Feeding tubes are often given to those who have trouble swallowing or cannot take food or medicine. A feeding tube can:   Go into the nose and down to the stomach.  Go through the skin in the belly (abdomen) and into the stomach or small bowel. SUPPLIES NEEDED TO CARE FOR THE TUBE  SITE  Clean gloves.  Clean wash cloth, gauze pads, or soft paper towel.  Cotton swabs.  Skin barrier ointment or cream.  Soap and water.  Precut foam pads or gauze (that go around the tube).  Tube tape. TUBE SITE CARE  Have all supplies ready.  Wash hands well.   Put on clean gloves.  Remove dirty foam pads or gauze near the tube site, if present.  Check the skin around the tube site for redness, rash, puffiness (swelling), leaking fluid, or extra tissue growth. Call your doctor if you see any of these.  Wet the gauze and cotton swabs with water and soap.  Wipe the area closest to the tube with cotton swabs. Wipe the surrounding skin with moistened gauze. Rinse with water.  Dry the skin and tube site with a dry gauze pad or soft paper towel. Do not use antibiotic ointments at the tube site.  If the skin is red, apply petroleum jelly in a circular motion, using a cotton swab. Your doctor may suggest a different cream or ointment. Use what the doctor suggests.  Apply a new pre-cut foam pad or gauze around the tube. Tape the edges down. Foam pads or gauze may be left off if there is no fluid at the tube site.  Use tape or a device that will attach your feeding tube to your skin or do as directed. Rotate where you tape the tube.  Sit the person up.  Throw away used supplies.  Remove gloves.  Wash hands. SUPPLIES NEEDED TO FLUSH A FEEDING TUBE  Clean gloves.  60 mL syringe (that connects to feeding tube).  Towel.  Water. FLUSHING A FEEDING TUBE  1. Have all supplies ready. 2. Wash hands well. 3. Put on clean gloves. 4. Pull 30 mL of water into the syringe. 5. Bend (kink) the feeding tube while disconnecting it from the feeding-bag tubing or while removing the plug at the end of the tube. 6. Insert the tip of the syringe into the end of the feeding tube. Stop bending the tube. Slowly inject the water. 7. If you cannot inject the water, the person with the  feeding tube should lay on their left side. 8. After injecting the water, remove the syringe. 9. Always flush the tube before giving the first medicine, between medicines, and after the final medicine before starting a feeding. 10. Throw away used supplies. 11. Remove gloves. 12. Wash hands.   This information is not intended to replace advice given to you by your health care provider. Make sure you discuss any questions you have with your health care provider.   Document Released: 03/22/2012 Document Reviewed: 03/22/2012 Elsevier Interactive Patient Education 2016 Elsevier Inc.  Gastrostomy Tube Replacement A gastrostomy tube replacement is a procedure to change the tube that goes into your stomach. This may be a planned procedure, or it may be an emergency procedure  if your tube has come out or is not working.  LET [facility] CARE PROVIDER KNOW ABOUT:  Any allergies you have.  All medicines you are taking, including vitamins, herbs, eye drops, creams, and over-the-counter medicines.  Previous problems you or members of your family have had with the use of anesthetics.  Any blood disorders you have.  Any surgeries you have had.  Medical conditions you have. RISKS AND COMPLICATIONS Generally, this is a safe procedure. However, problems can occur and include:  Bleeding.  Infection.  Leaking. BEFORE THE PROCEDURE  Let your health care provider know how long you have had your gastrostomy tube. If your tube has been in place for less than two weeks, you may need another surgical placement procedure, not just a replacement.  If your tube has come out at home, bring the tube with you so your health care provider can see the type you are using. PROCEDURE  You may be given a medicine that numbs the insertion area (local anesthetic).  If your gastrostomy tube is partially displaced, or is in place but not working, it will be removed.  If you have an inflatable tube, your  health care provider may deflate the balloon at the end of the tube with a syringe.  Your health care provider will apply pressure to your belly as the tube is pulled out. Then the health care provider will gently probe the opening of your gastrostomy to check it.  The opening for the gastrostomy tube may be lubricated with a jelly-like ointment.  You will get a new tube.  If the new tube does not go in easily, you may have a smaller tube put in to keep the track open.  The new tube will be secured in place. AFTER THE PROCEDURE  You may have an X-ray to make sure the new tube is in the right place and is working well. To do this, your health care provider will put a liquid that shows up on X-rays through the tube. The X-ray checks that the fluid is not leaking outside of your stomach.   This information is not intended to replace advice given to you by your health care provider. Make sure you discuss any questions you have with your health care provider.   Document Released: 03/23/2001 Document Revised: 07/19/2014 Document Reviewed: 11/07/2013 Elsevier Interactive Patient Education 2016 Elsevier Inc.  Gastrostomy Tube Replacement, Care After Refer to this sheet in the next few weeks. These instructions provide you with information on caring for yourself after your procedure. Your health care provider may also give you more specific instructions. Your treatment has been planned according to current medical practices, but problems sometimes occur. Call your health care provider if you have any problems or questions after your procedure. WHAT TO EXPECT AFTER THE PROCEDURE  After your procedure, it is typical to have the following:   Mild abdominal pain.  A small amount of blood-tinged fluid leaking from the replacement site. HOME CARE INSTRUCTIONS  You may resume your normal level of activity.  You may resume your normal feedings.  Care for your gastrostomy tube as you did before, or  as directed by your health care provider. SEEK MEDICAL CARE IF:  You have a fever or chills.  You have redness or irritation near the insertion site.  You continue to have abdominal pain or leaking around your gastrostomy tube. SEEK IMMEDIATE MEDICAL CARE IF:   You develop bleeding or significant discharge around the tube.  You have severe abdominal pain.  Your new tube does not seem to be working properly.  You are unable to get feedings into the tube.  Your tube comes out for any reason.    This information is not intended to replace advice given to you by your health care provider. Make sure you discuss any questions you have with your health care provider.   Document Released: 01/23/2014 Document Reviewed: 01/23/2014 Elsevier Interactive Patient Education [employer] Elsevier Inc.

## 2015-06-08 NOTE — ED Notes (Addendum)
X-ray at bedside

## 2015-06-08 NOTE — ED Notes (Signed)
Pt presents to ED via EMS from [facility] with c/o of G-tube dislodgement. EMS states length of time without tube is unknown per staff. EMS reports staff found pt without tube. EMS states pt has a hx of CVA, HTN, GERD, chronic tachycardia, and left side weakness residual.

## 2015-06-10 ENCOUNTER — Emergency Department: Payer: Medicare Other

## 2015-06-10 ENCOUNTER — Emergency Department
Admission: EM | Admit: 2015-06-10 | Discharge: 2015-06-10 | Disposition: A | Discharge status: Home / Self Care | Payer: Medicare Other | Attending: Emergency Medicine | Admitting: Emergency Medicine

## 2015-06-10 ENCOUNTER — Encounter: Payer: Self-pay | Admitting: *Deleted

## 2015-06-10 DIAGNOSIS — R198 Other specified symptoms and signs involving the digestive system and abdomen: Secondary | ICD-10-CM | POA: Diagnosis present

## 2015-06-10 DIAGNOSIS — K9423 Gastrostomy malfunction: Secondary | ICD-10-CM | POA: Diagnosis not present

## 2015-06-10 DIAGNOSIS — M6240 Contracture of muscle, unspecified site: Secondary | ICD-10-CM | POA: Insufficient documentation

## 2015-06-10 DIAGNOSIS — I10 Essential (primary) hypertension: Secondary | ICD-10-CM | POA: Insufficient documentation

## 2015-06-10 DIAGNOSIS — Z434 Encounter for attention to other artificial openings of digestive tract: Secondary | ICD-10-CM

## 2015-06-10 DIAGNOSIS — Z79899 Other long term (current) drug therapy: Secondary | ICD-10-CM | POA: Insufficient documentation

## 2015-06-10 DIAGNOSIS — T85528A Displacement of other gastrointestinal prosthetic devices, implants and grafts, initial encounter: Secondary | ICD-10-CM

## 2015-06-10 NOTE — ED Notes (Signed)
Pt sent from [facility], PEG was removed and replaced, sent here for placement confirmation

## 2015-06-10 NOTE — ED Provider Notes (Signed)
[facility] Emergency Department Provider Note  ____________________________________________  Time seen: Approximately 624 AM  I have reviewed the triage vital signs and the nursing notes.   HISTORY  Chief Complaint Abdominal Injury  History limited but patient limited verbal ability  HPI [name] [name] is a 81 y.o. male who comes into the hospital today after pulling out his G-tube. The patient was here 2 days ago for the same and has been here multiple times after pulling out his G-tube. The patient is from [facility]. They replaced the G-tube reports that they don't have a way to evaluate his tube so they sent him in to have it evaluated for proper placement. When asked if he has any pain the patient denies any pain or any complaints. He is sleeping comfortable without any difficulty. The patient has been seen again multiple times after pulling out his G-tube.   Past Medical History  Diagnosis Date  . Stroke (HCC)   . Hypertension   . GERD (gastroesophageal reflux disease)   . Hemorrhoids   . Asthma   . BPH (benign prostatic hyperplasia)   . VP (ventriculoperitoneal) shunt status   . Sacral decubitus ulcer     Patient Active Problem List   Diagnosis Date Noted  . GI bleed 05/11/2015  . Pressure ulcer 05/11/2015    History reviewed. No pertinent past surgical history.  Current Outpatient Rx  Name  Route  Sig  Dispense  Refill  . acetaminophen (TYLENOL) 500 MG tablet   Per NG tube   1,000 mg by Per NG tube route every 8 (eight) hours.         Marland Kitchen. amLODipine (NORVASC) 10 MG tablet   Per NG tube   10 mg by Per NG tube route daily.         Marland Kitchen. ascorbic acid (VITAMIN C) 500 MG/5ML syrup   Per Tube   Place 500 mg into feeding tube 2 (two) times daily.         . brimonidine (ALPHAGAN) 0.2 % ophthalmic solution   Both Eyes   Place 1 drop into both eyes 2 (two) times daily.         . cholecalciferol (VITAMIN D) 1000 UNITS tablet  Per Tube   Place 1,000 Units into feeding tube daily.          . collagenase (SANTYL) ointment   Topical   Apply 1 application topically 3 (three) times a week. Apply to ischial wound on Monday, Wednesday, and Friday, every day shift         . ferrous sulfate 325 (65 FE) MG tablet   Per Tube   Place 325 mg into feeding tube 2 (two) times daily.          . fluticasone (FLONASE) 50 MCG/ACT nasal spray   Each Nare   Place 1 spray into both nostrils daily.         Marland Kitchen. guaiFENesin (ROBITUSSIN) 100 MG/5ML SOLN   Oral   Take 20 mLs by mouth every 6 (six) hours as needed for cough or to loosen phlegm.         . hydrALAZINE (APRESOLINE) 25 MG tablet   Per Tube   Place 25 mg into feeding tube 4 (four) times daily.          Marland Kitchen. ipratropium-albuterol (DUONEB) 0.5-2.5 (3) MG/3ML SOLN   Nebulization   Take 3 mLs by nebulization every 6 (six) hours as needed (shortness of breath or wheezing).         .Marland Kitchen  lamoTRIgine (LAMICTAL) 25 MG tablet   Per Tube   Place 25 mg into feeding tube daily.          . lansoprazole (PREVACID SOLUTAB) 30 MG disintegrating tablet   Oral   Take 1 tablet (30 mg total) by mouth 2 (two) times daily.   60 tablet   0   . latanoprost (XALATAN) 0.005 % ophthalmic solution   Both Eyes   Place 1 drop into both eyes daily.          Marland Kitchen lidocaine (XYLOCAINE) 5 % ointment   Topical   Apply 1 application topically every Monday, Wednesday, and Friday. Apply to sacral wound every day shift for before wound vac change.         . loperamide (IMODIUM A-D) 2 MG tablet   Oral   Take 2 mg by mouth every 4 (four) hours as needed for diarrhea or loose stools.         Marland Kitchen loperamide (IMODIUM) 1 MG/5ML solution   Oral   Take 5 mLs by mouth every 8 (eight) hours as needed for diarrhea or loose stools.          . Menthol, Topical Analgesic, (BIOFREEZE) 4 % GEL   Apply externally   Apply 1 application topically every 8 (eight) hours.         . Multiple  Vitamin (MULTIVITAMIN) LIQD   PEG Tube   5 mLs by PEG Tube route daily.          . potassium chloride 20 MEQ/15ML (10%) SOLN   Per Tube   Place 20 mEq into feeding tube daily.          . Probiotic Product (ALIGN) 4 MG CAPS   Oral   Take 4 mg by mouth daily.         Marland Kitchen selenium 50 MCG TABS tablet   Per Tube   Place 50 mcg into feeding tube daily.          . sodium chloride (OCEAN) 0.65 % SOLN nasal spray   Each Nare   Place 2 sprays into both nostrils every 8 (eight) hours as needed for congestion.         . traMADol (ULTRAM) 50 MG tablet   Per Tube   Place 50 mg into feeding tube every 8 (eight) hours as needed for moderate pain or severe pain.          Marland Kitchen zinc sulfate 220 MG capsule   Oral   Take 220 mg by mouth daily.           Allergies Atorvastatin; Prednisone; Silodosin; Warfarin sodium; Gabapentin; Metoprolol tartrate; Orange juice; Pregabalin; Flomax ; and Morphine and related  Family History  Problem Relation Age of Onset  . CAD Mother     Social History Social History  Substance Use Topics  . Smoking status: Never Smoker   . Smokeless tobacco: None  . Alcohol Use: No    Review of Systems  10-point ROS otherwise negative.  ____________________________________________   PHYSICAL EXAM:  VITAL SIGNS: ED Triage Vitals  Enc Vitals Group     BP 06/10/15 0626 110/92 mmHg     Pulse Rate 06/10/15 0626 106     Resp --      Temp 06/10/15 0626 97.7 F (36.5 C)     Temp Source 06/10/15 0626 Oral     SpO2 06/10/15 0626 96 %     Weight 06/10/15 0626 144 lb (65.318 kg)  Height 06/10/15 0626 6' (1.829 m)     Head Cir --      Peak Flow --      Pain Score --      Pain Loc --      Pain Edu? --      Excl. in GC? --     Constitutional: Alert . Well appearing and in no acute distress. Eyes: Conjunctivae are normal. PERRL. EOMI. Head: Atraumatic. Nose: No congestion/rhinnorhea. Mouth/Throat: Mucous membranes are moist.  Oropharynx  non-erythematous. Cardiovascular: Normal rate, regular rhythm. Grossly normal heart sounds.  Good peripheral circulation. Respiratory: Normal respiratory effort.  No retractions. Lungs CTAB. Gastrointestinal: Soft and nontender. No distention. Positive bowel sounds, G tube site clean Musculoskeletal: No lower extremity tenderness nor edema.   Neurologic:  contractures Skin:  Skin is warm, dry and intact. Psychiatric: Mood and affect are normal.   ____________________________________________   LABS (all labs ordered are listed, but only abnormal results are displayed)  Labs Reviewed - No data to display ____________________________________________  EKG  None ____________________________________________  RADIOLOGY  KUB: Contrast remains in the stomach similar to yesterday, no contrast leak or bowel obstruction, gastrostomy tube retention balloon remains in the body of the stomach. ____________________________________________   PROCEDURES  Procedure(s) performed: None  Critical Care performed: No  ____________________________________________   INITIAL IMPRESSION / ASSESSMENT AND PLAN / ED COURSE  Pertinent labs & imaging results that were available during my care of the patient were reviewed by me and considered in my medical decision making (see chart for details).  It appears as though the patient's tube is in the appropriate place. The patient is sleeping comfortably. I did speak with his daughter and she is asking if there is ways to keep him from pulling out his 2 or reason for him to continually pull out his tube. I informed her that there is really no way to ensure it unless it is secured appropriately by the facility and he is watched more closely. I did suggest changing his tube to [facility] or a mini G-tube. Otherwise the patient is in no acute distress he will be discharged back to his nursing facility. ____________________________________________   FINAL CLINICAL  IMPRESSION(S) / ED DIAGNOSES  Final diagnoses:  Gastrojejunostomy tube dislodgement (HCC)      Rebecka Apley, MD 06/10/15 [phone]

## 2015-06-12 ENCOUNTER — Encounter: Payer: Medicare Other | Attending: Surgery | Admitting: Surgery

## 2015-06-12 DIAGNOSIS — E441 Mild protein-calorie malnutrition: Secondary | ICD-10-CM | POA: Insufficient documentation

## 2015-06-12 DIAGNOSIS — G8190 Hemiplegia, unspecified affecting unspecified side: Secondary | ICD-10-CM | POA: Diagnosis not present

## 2015-06-12 DIAGNOSIS — L89153 Pressure ulcer of sacral region, stage 3: Secondary | ICD-10-CM | POA: Insufficient documentation

## 2015-06-18 NOTE — Progress Notes (Signed)
Uber, Cadin ([id number]) Visit Report for 06/12/2015 Arrival Information Details Patient Name: Albergo, Kenwood Date of Service: 06/12/2015 12:45 PM Medical Record Number: [id number] Patient Account Number: [id number] Date of Birth/Sex: [birth year] (81 y.o. Male) Treating RN: Phillis Haggis Primary Care Physician: Christena Flake Other Clinician: Referring Physician: Audree Bane Treating Physician/Extender: Rudene Re in Treatment: 7 Visit Information History Since Last Visit All ordered tests and consults were completed: No Patient Arrived: Stretcher Added or deleted any medications: No Arrival Time: 12:53 Any new allergies or adverse reactions: No Accompanied By: daughter Had a fall or experienced change in No Transfer Assistance: Manual activities of daily living that may affect Patient Identification Verified: Yes risk of falls: Secondary Verification Process Yes Signs or symptoms of abuse/neglect since last No Completed: visito Patient Requires Transmission-Based No Hospitalized since last visit: No Precautions: Pain Present Now: No Patient Has Alerts: No Electronic Signature(s) Signed: 06/17/2015 6:00:56 PM By: Alejandro Mulling Entered By: Alejandro Mulling on 06/12/2015 13:26:26 Lomba, Brendt ([id number]) -------------------------------------------------------------------------------- Clinic Level of Care Assessment Details Patient Name: Pafford, Spenser Date of Service: 06/12/2015 12:45 PM Medical Record Number: [id number] Patient Account Number: [id number] Date of Birth/Sex: [birth year] (81 y.o. Male) Treating RN: Phillis Haggis Primary Care Physician: Christena Flake Other Clinician: Referring Physician: Audree Bane Treating Physician/Extender: Rudene Re in Treatment: 7 Clinic Level of Care Assessment Items TOOL 4 Quantity Score  - Use when only an EandM is performed on FOLLOW-UP visit 0 ASSESSMENTS - Nursing Assessment / Reassessment X - Reassessment of  Co-morbidities (includes updates in patient status) 1 10  - Reassessment of Adherence to Treatment Plan 0 ASSESSMENTS - Wound and Skin Assessment / Reassessment  - Simple Wound Assessment / Reassessment - one wound 0 X - Complex Wound Assessment / Reassessment - multiple wounds 1 5  - Dermatologic / Skin Assessment (not related to wound area) 0 ASSESSMENTS - Focused Assessment  - Circumferential Edema Measurements - multi extremities 0  - Nutritional Assessment / Counseling / Intervention 0  - Lower Extremity Assessment (monofilament, tuning fork, pulses) 0  - Peripheral Arterial Disease Assessment (using hand held doppler) 0 ASSESSMENTS - Ostomy and/or Continence Assessment and Care X - Incontinence Assessment and Management 1 10  - Ostomy Care Assessment and Management (repouching, etc.) 0 PROCESS - Coordination of Care X - Simple Patient / Family Education for ongoing care 1 15  - Complex (extensive) Patient / Family Education for ongoing care 0  - Staff obtains Chiropractor, Records, Test Results / Process Orders 0  - Staff telephones HHA, Nursing Homes / Clarify orders / etc 0  - Routine Transfer to another Facility (non-emergent condition) 0 Dumler, Rastus ([id number])  - Routine Hospital Admission (non-emergent condition) 0  - New Admissions / Manufacturing engineer / Ordering NPWT, Apligraf, etc. 0  - Emergency Hospital Admission (emergent condition) 0 X - Simple Discharge Coordination 1 10  - Complex (extensive) Discharge Coordination 0 PROCESS - Special Needs  - Pediatric / Minor Patient Management 0  - Isolation Patient Management 0  - Hearing / Language / Visual special needs 0  - Assessment of Community assistance (transportation, D/C planning, etc.) 0  - Additional assistance / Altered mentation 0  - Support Surface(s) Assessment (bed, cushion, seat, etc.) 0 INTERVENTIONS - Wound Cleansing / Measurement  - Simple Wound  Cleansing - one wound 0 X - Complex Wound Cleansing - multiple wounds 1 5  - Wound Imaging (photographs - any number of wounds) 0  - Wound Tracing (instead of photographs) 0  -  Simple Wound Measurement - one wound 0 X - Complex Wound Measurement - multiple wounds 1 5 INTERVENTIONS - Wound Dressings X - Small Wound Dressing one or multiple wounds 1 10 X - Medium Wound Dressing one or multiple wounds 1 15 []  - Large Wound Dressing one or multiple wounds 0 []  - Application of Medications - topical 0 []  - Application of Medications - injection 0 INTERVENTIONS - Miscellaneous []  - External ear exam 0 Mcginniss, Antonino ([id number]) []  - Specimen Collection (cultures, biopsies, blood, body fluids, etc.) 0 []  - Specimen(s) / Culture(s) sent or taken to Lab for analysis 0 []  - Patient Transfer (multiple staff / Michiel Sites Lift / Similar devices) 0 []  - Simple Staple / Suture removal (25 or less) 0 []  - Complex Staple / Suture removal (26 or more) 0 []  - Hypo / Hyperglycemic Management (close monitor of Blood Glucose) 0 []  - Ankle / Brachial Index (ABI) - do not check if billed separately 0 X - Vital Signs 1 5 Has the patient been seen at the hospital within the last three years: Yes Total Score: 90 Level Of Care: New/Established - Level 3 Electronic Signature(s) Signed: 06/17/2015 6:00:56 PM By: Alejandro Mulling Entered By: Alejandro Mulling on 06/12/2015 14:13:52 Minardi, Johnothan ([id number]) -------------------------------------------------------------------------------- Encounter Discharge Information Details Patient Name: Neace, Stanislav Date of Service: 06/12/2015 12:45 PM Medical Record Number: [id number] Patient Account Number: [id number] Date of Birth/Sex: 1931/03/26 (81 y.o. Male) Treating RN: Phillis Haggis Primary Care Physician: Christena Flake Other Clinician: Referring Physician: Audree Bane Treating Physician/Extender: Rudene Re in Treatment: 7 Encounter Discharge Information  Items Discharge Pain Level: 0 Discharge Condition: Stable Ambulatory Status: Stretcher Discharge Destination: Nursing Home Transportation: Ambulance Accompanied By: daughter Schedule Follow-up Appointment: Yes Medication Reconciliation completed and provided to Patient/Care Yes Yanelly Cantrelle: Provided on Clinical Summary of Care: 06/12/2015 Form Type Recipient Paper Patient OS Electronic Signature(s) Signed: 06/12/2015 3:56:19 PM By: Gwenlyn Perking Entered By: Gwenlyn Perking on 06/12/2015 15:56:18 Dowler, Samuel ([id number]) -------------------------------------------------------------------------------- Lower Extremity Assessment Details Patient Name: Awadallah, Belford Date of Service: 06/12/2015 12:45 PM Medical Record Number: [id number] Patient Account Number: [id number] Date of Birth/Sex: September 18, 1930 (81 y.o. Male) Treating RN: Phillis Haggis Primary Care Physician: Christena Flake Other Clinician: Referring Physician: Audree Bane Treating Physician/Extender: Rudene Re in Treatment: 7 Electronic Signature(s) Signed: 06/17/2015 6:00:56 PM By: Alejandro Mulling Entered By: Alejandro Mulling on 06/12/2015 13:27:50 Weyenberg, Martinez ([id number]) -------------------------------------------------------------------------------- Multi Wound Chart Details Patient Name: Jaskulski, Abrar Date of Service: 06/12/2015 12:45 PM Medical Record Number: [id number] Patient Account Number: [id number] Date of Birth/Sex: 02/16/1931 (81 y.o. Male) Treating RN: Phillis Haggis Primary Care Physician: Christena Flake Other Clinician: Referring Physician: Audree Bane Treating Physician/Extender: Rudene Re in Treatment: 7 Vital Signs Height(in): Pulse(bpm): 105 Weight(lbs): Blood Pressure 121/81 (mmHg): Body Mass Index(BMI): Temperature(F): 98.7 Respiratory Rate 16 (breaths/min): Photos: [2:No Photos] [N/A:N/A] Wound Location: [2:Sacrum - Medial] [N/A:N/A] Wounding Event: [2:Pressure Injury]  [N/A:N/A] Primary Etiology: [2:Pressure Ulcer] [N/A:N/A] Comorbid History: [2:Cataracts, Glaucoma, Anemia, Hypertension, Osteoarthritis] [N/A:N/A] Date Acquired: [2:12/09/2014] [N/A:N/A] Weeks of Treatment: [2:7] [N/A:N/A] Wound Status: [2:Open] [N/A:N/A] Measurements L x W x D 5.4x5.5x0.3 [N/A:N/A] (cm) Area (cm) : [2:23.326] [N/A:N/A] Volume (cm) : [2:6.998] [N/A:N/A] % Reduction in Area: [2:55.90%] [N/A:N/A] % Reduction in Volume: 33.90% [N/A:N/A] Starting Position 1 9 (o'clock): Ending Position 1 [2:10] (o'clock): Maximum Distance 1 0.4 (cm): Undermining: [2:Yes] [N/A:N/A] 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] [N/A:N/A] Exudate Amount: [2:Large] [N/A:N/A] Exudate Type: [2:Serosanguineous] [N/A:N/A] Exudate Color: [2:red, brown] [N/A:N/A] Foul Odor After [2:Yes] [N/A:N/A] Cleansing: [2:No] [N/A:N/A] Odor Anticipated  Due to Product Use: Wound Margin: Distinct, outline attached N/A N/A Granulation Amount: Large (67-100%) N/A N/A Granulation Quality: Red N/A N/A Necrotic Amount: None Present (0%) N/A N/A Exposed Structures: Fascia: No N/A N/A Fat: No Tendon: No Muscle: No Joint: No Bone: No Limited to Skin Breakdown Epithelialization: None N/A N/A Periwound Skin Texture: Edema: No N/A N/A Excoriation: No Induration: No Callus: No Crepitus: No Fluctuance: No Friable: No Rash: No Scarring: No Periwound Skin Moist: Yes N/A N/A Moisture: Maceration: No Dry/Scaly: No Periwound Skin Color: Atrophie Blanche: No N/A N/A Cyanosis: No Ecchymosis: No Erythema: No Hemosiderin Staining: No Mottled: No Pallor: No Rubor: No Temperature: No Abnormality N/A N/A Tenderness on No N/A N/A Palpation: Wound Preparation: Ulcer Cleansing: N/A N/A Rinsed/Irrigated with Saline Topical Anesthetic Applied: None, Other: lidocaine 4% Treatment Notes Electronic Signature(s) Breach, Coady ([id number]) Signed: 06/17/2015 6:00:56 PM By: Alejandro Mulling Entered By:  Alejandro Mulling on 06/12/2015 13:43:55 Loureiro, Trysten ([id number]) -------------------------------------------------------------------------------- Multi-Disciplinary Care Plan Details Patient Name: Fulgham, Leshaun Date of Service: 06/12/2015 12:45 PM Medical Record Number: [id number] Patient Account Number: [id number] Date of Birth/Sex: 09-27-1930 (81 y.o. Male) Treating RN: Phillis Haggis Primary Care Physician: Christena Flake Other Clinician: Referring Physician: Audree Bane Treating Physician/Extender: Rudene Re in Treatment: 7 Active Inactive Orientation to the Wound Care Program Nursing Diagnoses: Knowledge deficit related to the wound healing center program Goals: Patient/caregiver will verbalize understanding of the Wound Healing Center Program Date Initiated: 05/26/2015 Goal Status: Active Interventions: Provide education on orientation to the wound center Notes: Wound/Skin Impairment Nursing Diagnoses: Impaired tissue integrity Goals: Ulcer/skin breakdown will heal within 14 weeks Date Initiated: 05/26/2015 Goal Status: Active Interventions: Provide education on ulcer and skin care Notes: Electronic Signature(s) Signed: 06/17/2015 6:00:56 PM By: Alejandro Mulling Entered By: Alejandro Mulling on 06/12/2015 13:43:47 Eberly, Davonte ([id number]) -------------------------------------------------------------------------------- Pain Assessment Details Patient Name: Berg, Oden Date of Service: 06/12/2015 12:45 PM Medical Record Number: [id number] Patient Account Number: [id number] Date of Birth/Sex: 10/16/1930 (81 y.o. Male) Treating RN: Phillis Haggis Primary Care Physician: Christena Flake Other Clinician: Referring Physician: Audree Bane Treating Physician/Extender: Rudene Re in Treatment: 7 Active Problems Location of Pain Severity and Description of Pain Patient Has Paino No Site Locations With Dressing Change: Yes Pain Management and  Medication Current Pain Management: Notes Pt only had facial grimaces and grunting. He never would say if he was in pain. Electronic Signature(s) Signed: 06/17/2015 6:00:56 PM By: Alejandro Mulling Entered By: Alejandro Mulling on 06/12/2015 13:27:38 Digiulio, Samarion ([id number]) -------------------------------------------------------------------------------- Patient/Caregiver Education Details Patient Name: Oh, Nichlas Date of Service: 06/12/2015 12:45 PM Medical Record Number: [id number] Patient Account Number: [id number] Date of Birth/Gender: 28-Dec-1930 (81 y.o. Male) Treating RN: Phillis Haggis Primary Care Physician: Christena Flake Other Clinician: Referring Physician: Audree Bane Treating Physician/Extender: Rudene Re in Treatment: 7 Education Assessment Education Provided To: Patient and Caregiver daughter Education Topics Provided Wound/Skin Impairment: Handouts: Other: change dressings as ordered Methods: Demonstration, Explain/Verbal Responses: State content correctly Electronic Signature(s) Signed: 06/17/2015 6:00:56 PM By: Alejandro Mulling Entered By: Alejandro Mulling on 06/12/2015 14:15:29 Barto, Ulis ([id number]) -------------------------------------------------------------------------------- Wound Assessment Details Patient Name: Riehl, Zalan Date of Service: 06/12/2015 12:45 PM Medical Record Number: [id number] Patient Account Number: [id number] Date of Birth/Sex: 12-08-1930 (81 y.o. Male) Treating RN: Phillis Haggis Primary Care Physician: Christena Flake Other Clinician: Referring Physician: Audree Bane Treating Physician/Extender: Rudene Re in Treatment: 7 Wound Status Wound Number: 2 Primary Pressure Ulcer Etiology: Wound Location: Sacrum - Medial Wound Open Wounding Event: Pressure Injury Status: Date Acquired: 12/09/2014  Comorbid Cataracts, Glaucoma, Anemia, Weeks Of Treatment: 7 History: Hypertension, Osteoarthritis Clustered Wound:  No Photos Photo Uploaded By: Alejandro MullingPinkerton, Debra on 06/12/2015 17:04:28 Wound Measurements Length: (cm) 5.4 Width: (cm) 5.5 Depth: (cm) 0.3 Area: (cm) 23.326 Volume: (cm) 6.998 % Reduction in Area: 55.9% % Reduction in Volume: 33.9% Epithelialization: None Undermining: Yes Starting Position (o'clock): 9 Ending Position (o'clock): 10 Maximum Distance: (cm) 0.4 Wound Description Classification: Category/Stage IV Wound Margin: Distinct, outline attached Exudate Amount: Large Exudate Type: Serosanguineous Exudate Color: red, brown Foul Odor After Cleansing: Yes Due to Product Use: No Wound Bed Granulation Amount: Large (67-100%) Exposed Structure Lonon, Tymere ([id number]) Granulation Quality: Red Fascia Exposed: No Necrotic Amount: None Present (0%) Fat Layer Exposed: No Tendon Exposed: No Muscle Exposed: No Joint Exposed: No Bone Exposed: No Limited to Skin Breakdown Periwound Skin Texture Texture Color No Abnormalities Noted: No No Abnormalities Noted: No Callus: No Atrophie Blanche: No Crepitus: No Cyanosis: No Excoriation: No Ecchymosis: No Fluctuance: No Erythema: No Friable: No Hemosiderin Staining: No Induration: No Mottled: No Localized Edema: No Pallor: No Rash: No Rubor: No Scarring: No Temperature / Pain Moisture Temperature: No Abnormality No Abnormalities Noted: No Dry / Scaly: No Maceration: No Moist: Yes Wound Preparation Ulcer Cleansing: Rinsed/Irrigated with Saline Topical Anesthetic Applied: None, Other: lidocaine 4%, Treatment Notes Wound #2 (Medial Sacrum) 1. Cleansed with: Clean wound with Normal Saline 2. Anesthetic Topical Lidocaine 4% cream to wound bed prior to debridement 4. Dressing Applied: Aquacel Ag Prisma Ag 5. Secondary Dressing Applied Bordered Foam Dressing Electronic Signature(s) Signed: 06/17/2015 6:00:56 PM By: Alejandro MullingPinkerton, Debra Entered By: Alejandro MullingPinkerton, Debra on 06/12/2015 13:56:24 Bowker, Iseah  ([id number]) Ciszek, Sony ([id number]) -------------------------------------------------------------------------------- Wound Assessment Details Patient Name: Northrop, Jhovani Date of Service: 06/12/2015 12:45 PM Medical Record Number: [id number] Patient Account Number: [id number] Date of Birth/Sex: 01/13/1931 [age] y.o. Male) Treating RN: Phillis HaggisPinkerton, Debi Primary Care Physician: Christena FlakeARD, JOHN Other Clinician: Referring Physician: Audree BaneKING, PETER Treating Physician/Extender: Rudene ReBritto, Errol Weeks in Treatment: 7 Wound Status Wound Number: 3 Primary Pressure Ulcer Etiology: Wound Location: Sacrum - Midline, Superior Wound Open Wounding Event: Pressure Injury Status: Date Acquired: 12/16/2014 Comorbid Cataracts, Glaucoma, Anemia, Weeks Of Treatment: 0 History: Hypertension, Osteoarthritis Clustered Wound: No Photos Photo Uploaded By: Alejandro MullingPinkerton, Debra on 06/12/2015 17:04:29 Wound Measurements Length: (cm) 1.2 Width: (cm) 0.3 Depth: (cm) 0.4 Area: (cm) 0.283 Volume: (cm) 0.113 % Reduction in Area: 0% % Reduction in Volume: 0% Epithelialization: None Tunneling: No Undermining: No Wound Description Classification: Category/Stage III Wound Margin: Epibole Exudate Amount: Small Exudate Type: Serous Exudate Color: amber Foul Odor After Cleansing: Yes Due to Product Use: No Wound Bed Granulation Amount: Large (67-100%) Exposed Structure Granulation Quality: Pink Fascia Exposed: No Fat Layer Exposed: No Tendon Exposed: No Faul, Abyan ([id number]) Muscle Exposed: No Joint Exposed: No Bone Exposed: No Limited to Skin Breakdown Periwound Skin Texture Texture Color No Abnormalities Noted: Yes No Abnormalities Noted: Yes Moisture Temperature / Pain No Abnormalities Noted: No Temperature: No Abnormality Moist: Yes Tenderness on Palpation: Yes Wound Preparation Ulcer Cleansing: Rinsed/Irrigated with Saline Topical Anesthetic Applied: None Treatment Notes Wound #3 (Midline,  Superior Sacrum) 1. Cleansed with: Clean wound with Normal Saline 2. Anesthetic Topical Lidocaine 4% cream to wound bed prior to debridement 4. Dressing Applied: Aquacel Ag Prisma Ag 5. Secondary Dressing Applied Bordered Foam Dressing Electronic Signature(s) Signed: 06/17/2015 6:00:56 PM By: Alejandro MullingPinkerton, Debra Entered By: Alejandro MullingPinkerton, Debra on 06/12/2015 13:58:51 Fencl, Vera ([id number]) -------------------------------------------------------------------------------- Vitals Details Patient Name: Muro, Mehdi Date of Service: 06/12/2015 12:45 PM Medical Record Number:  [id number] Patient Account Number: [id number] Date of Birth/Sex: 03-Jan-1931 (81 y.o. Male) Treating RN: Phillis Haggis Primary Care Physician: Christena Flake Other Clinician: Referring Physician: Audree Bane Treating Physician/Extender: Rudene Re in Treatment: 7 Vital Signs Time Taken: 12:54 Temperature (F): 98.7 Pulse (bpm): 105 Respiratory Rate (breaths/min): 16 Blood Pressure (mmHg): 121/81 Reference Range: 80 - 120 mg / dl Electronic Signature(s) Signed: 06/17/2015 6:00:56 PM By: Alejandro Mulling Entered By: Alejandro Mulling on 06/12/2015 13:27:44

## 2015-06-18 NOTE — Progress Notes (Signed)
[name] [name] ([id number]) Visit Report for 06/12/2015 Chief Complaint Document Details Patient Name: [name] [name], [name] [name] Date of Service: 06/12/2015 12:45 PM Medical Record Number: [id number] Patient Account Number: [id number] Date of Birth/Sex: 04/01/1931 11(81 y.o. Male) Treating RN: [name] [name] Primary Care Physician: [name] [name] Other Clinician: Referring Physician: Audree BaneKING, [name] Treating Physician/Extender: [name] [name] in Treatment: 7 Information Obtained from: Caregiver Chief Complaint Patient is at the clinic for treatment of an open pressure ulcer. The patient comes along with his daughter who is the caregiver and lives in an assisted living facility and has had a ulcerative area on the sacrum for the last 6 months. Electronic Signature(s) Signed: 06/12/2015 2:21:07 PM By: [name] KannerBritto, Arty [name] [name] [name] Entered By: [name] [name] on 06/12/2015 14:21:06 [name] [name], [name] [name] ([id number]) -------------------------------------------------------------------------------- HPI Details Patient Name: [name] [name], [name] [name] Date of Service: 06/12/2015 12:45 PM Medical Record Number: [id number] Patient Account Number: [id number] Date of Birth/Sex: 03/31/1931 32(81 y.o. Male) Treating RN: [name] [name] Primary Care Physician: [name] [name] Other Clinician: Referring Physician: Audree BaneKING, [name] Treating Physician/Extender: [name] [name] [name] in Treatment: 7 History of Present Illness Location: pressure injury to the sacral region and the left low back area. Quality: Patient reports experiencing a dull pain to affected area(s). Severity: Patient states wound are getting worse. Duration: Patient has had the wound for > 3 months prior to seeking treatment at the wound center Timing: Pain in wound is Intermittent (comes and goes Context: The wound appeared gradually over time Modifying Factors: Consults to this date include: wound VAC applications and local care Associated Signs and Symptoms: Wound has significant periowound  erythema and localized edema HPI Description: 81 year old gentleman who had a stroke in February of this year and has been bedbound since then. Since March he has developed a pressure injury and ulceration and has been treated at assisted living with a wound VAC and other supportive care. His had a PEG tube in place and though he eats some food during the day he gets supplementary PEG tube feeds between 6 PM and 6 AM. Has not had diabetes mellitus and was pretty active before his illness. The patient was seen here in July and after that had a problem where [facility] and at that time treated with a wound VAC and then discharged to a skilled nursing facility. His plastic surgery appointment was canceled and he has not yet seen them. the nursing home has been changing his wound VAC. His nutrition has improved and is getting adequate tube feeds supplements and vitamins. 05/06/2015 -- [facility] and found to be unfit for surgery and reconstruction. His daughter confirms that they have not given him an appointment again. 05/26/2015 - he has not been here for about 3 [name] and today when he came here his wound VAC was not applied. 06/12/2015 -- the patient missed her last appointment as he was in the ER with a PEG tube which had come out and needed it to be replaced. He is doing fine otherwise. Electronic Signature(s) Signed: 06/12/2015 2:21:40 PM By: [name] KannerBritto, Kidada Ging [name] [name] Entered By: [name] KannerBritto, Kelcy Baeten on 06/12/2015 14:21:39 [name] [name] ([id number]) -------------------------------------------------------------------------------- Physical Exam Details Patient Name: [name] [name] Date of Service: 06/12/2015 12:45 PM Medical Record Number: [id number] Patient Account Number: [id number] Date of Birth/Sex: 02/27/1931 57(81 y.o. Male) Treating RN: [name] [name] Primary Care Physician: [name] [name] Other  Clinician: Referring Physician: Audree BaneKING, [name] Treating Physician/Extender: [name] ReBritto, Bradlee Heitman [name] in Treatment: 7 Constitutional .  Pulse regular. Respirations normal and unlabored. Afebrile. . Eyes Nonicteric. Reactive to light. Ears, Nose, Mouth, and Throat Lips, teeth, and gums WNL.Marland Kitchen Moist mucosa without lesions . Neck supple and nontender. No palpable supraclavicular or cervical adenopathy. Normal sized without goiter. Respiratory WNL. No retractions.. Cardiovascular Pedal Pulses WNL. No clubbing, cyanosis or edema. Lymphatic No adneopathy. No adenopathy. No adenopathy. Musculoskeletal Adexa without tenderness or enlargement.. Digits and nails w/o clubbing, cyanosis, infection, petechiae, ischemia, or inflammatory conditions.. Integumentary (Hair, Skin) No suspicious lesions. No crepitus or fluctuance. No peri-wound warmth or erythema. No masses.Marland Kitchen Psychiatric Judgement and insight Intact.. No evidence of depression, anxiety, or agitation.. Notes the patient's wound is looking good with healthy granulation tissue and on the superior lateral port on the right side there is a smaller wound which is healthy too. Electronic Signature(s) Signed: 06/12/2015 2:22:21 PM By: [name] [name] [name] [name] Entered By: [name] [name] on 06/12/2015 14:22:20 [name], [name] ([id number]) -------------------------------------------------------------------------------- Physician Orders Details Patient Name: [name] [name], [name] [name] Date of Service: 06/12/2015 12:45 PM Medical Record Number: [id number] Patient Account Number: [id number] Date of Birth/Sex: 09-19-1930 (81 y.o. Male) Treating RN: Phillis Haggis Primary Care Physician: [name] Flake Other Clinician: Referring Physician: Audree Bane Treating Physician/Extender: [name] Re in Treatment: 7 Verbal / Phone Orders: Yes ClinicianAshok Cordia, Debi Read Back and Verified: Yes Diagnosis Coding Wound Cleansing Wound #2 Medial Sacrum o Clean wound with  Normal Saline. Wound #3 Midline,Superior Sacrum o Clean wound with Normal Saline. Skin Barriers/Peri-Wound Care Wound #2 Medial Sacrum o Skin Prep Wound #3 Midline,Superior Sacrum o Skin Prep Primary Wound Dressing Wound #2 Medial Sacrum o Aquacel Ag - at [facility]. Wound #3 Midline,Superior Sacrum o Prisma Ag - Cover with dry gauze and wound vac drape, change this dressing with wound vac changes at [facility] Secondary Dressing Wound #2 Medial Sacrum o Boardered Foam Dressing - At [facility] Dressing Change Frequency Wound #2 Medial Sacrum o Three times weekly Wound #3 Midline,Superior Sacrum o Three times weekly Follow-up Appointments Wound #2 Medial Sacrum o Return Appointment in 2 [name]. Scalise, [name] [name] ([id number]) Wound #3 Midline,Superior Sacrum o Return Appointment in 2 [name]. Negative Pressure Wound Therapy Wound #2 Medial Sacrum o Wound VAC settings at 125/130 mmHg continuous pressure. Use BLACK/GREEN foam to wound cavity. Use WHITE foam to fill any tunnel/s and/or undermining. Change VAC dressing 3 X WEEK. Change canister as indicated when full. Nurse may titrate settings and frequency of dressing changes as clinically indicated. - To be applied by Skilled Nursing facility Electronic Signature(s) Signed: 06/12/2015 4:31:17 PM By: [name] [name] [name] [name] Signed: 06/17/2015 6:00:56 PM By: Alejandro Mulling Entered By: Alejandro Mulling on 06/12/2015 14:04:50 [name] [name], [name] [name] ([id number]) -------------------------------------------------------------------------------- Problem List Details Patient Name: [name] [name], [name] [name] Date of Service: 06/12/2015 12:45 PM Medical Record Number: [id number] Patient Account Number: [id number] Date of Birth/Sex: 12/13/1930 (81 y.o. Male) Treating RN: [name] Sites Primary Care Physician: [name] Flake Other Clinician: Referring Physician: Audree Bane Treating Physician/Extender: [name] Re in Treatment: 7 Active  Problems ICD-10 Encounter Code Description Active Date Diagnosis L89.153 Pressure ulcer of sacral region, stage 3 04/22/2015 Yes G81.90 Hemiplegia, unspecified affecting unspecified side 04/22/2015 Yes E44.1 Mild protein-calorie malnutrition 04/22/2015 Yes Inactive Problems Resolved Problems Electronic Signature(s) Signed: 06/12/2015 2:21:00 PM By: [name] [name] [name] [name] Entered By: [name] [name] on 06/12/2015 14:21:00 [name] [name], [name] [name] ([id number]) -------------------------------------------------------------------------------- Progress Note Details Patient Name: [name] [name], [name] [name] Date of Service: 06/12/2015 12:45 PM Medical Record Number: [id number] Patient Account Number: [id number] Date of Birth/Sex: [birth year] (81 y.o. Male) Treating RN: [name] Sites  Primary Care Physician: [name] Flake Other Clinician: Referring Physician: Audree Bane Treating Physician/Extender: [name] Re in Treatment: 7 Subjective Chief Complaint Information obtained from Caregiver Patient is at the clinic for treatment of an open pressure ulcer. The patient comes along with his daughter who is the caregiver and lives in an assisted living facility and has had a ulcerative area on the sacrum for the last 6 months. History of Present Illness (HPI) The following HPI elements were documented for the patient's wound: Location: pressure injury to the sacral region and the left low back area. Quality: Patient reports experiencing a dull pain to affected area(s). Severity: Patient states wound are getting worse. Duration: Patient has had the wound for > 3 months prior to seeking treatment at the wound center Timing: Pain in wound is Intermittent (comes and goes Context: The wound appeared gradually over time Modifying Factors: Consults to this date include: wound VAC applications and local care Associated Signs and Symptoms: Wound has significant periowound erythema and localized edema 81 year old gentleman  who had a stroke in February of this year and has been bedbound since then. Since March he has developed a pressure injury and ulceration and has been treated at assisted living with a wound VAC and other supportive care. His had a PEG tube in place and though he eats some food during the day he gets supplementary PEG tube feeds between 6 PM and 6 AM. Has not had diabetes mellitus and was pretty active before his illness. The patient was seen here in July and after that had a problem where he was readmitted to hospital at [facility] and at that time treated with a wound VAC and then discharged to a skilled nursing facility. His plastic surgery appointment was canceled and he has not yet seen them. the nursing home has been changing his wound VAC. His nutrition has improved and is getting adequate tube feeds supplements and vitamins. 05/06/2015 -- was file was reviewed at the [facility] Department of reconstructive surgery and found to be unfit for surgery and reconstruction. His daughter confirms that they have not given him an appointment again. 05/26/2015 - he has not been here for about 3 [name] and today when he came here his wound VAC was not applied. 06/12/2015 -- the patient missed her last appointment as he was in the ER with a PEG tube which had come out and needed it to be replaced. He is doing fine otherwise. [name] [name], [name] [name] ([id number]) Objective Constitutional Pulse regular. Respirations normal and unlabored. Afebrile. Vitals Time Taken: 12:54 PM, Temperature: 98.7 F, Pulse: 105 bpm, Respiratory Rate: 16 breaths/min, Blood Pressure: 121/81 mmHg. Eyes Nonicteric. Reactive to light. Ears, Nose, Mouth, and Throat Lips, teeth, and gums WNL.Marland Kitchen Moist mucosa without lesions . Neck supple and nontender. No palpable supraclavicular or cervical adenopathy. Normal sized without goiter. Respiratory WNL. No retractions.. Cardiovascular Pedal Pulses WNL. No clubbing, cyanosis or  edema. Lymphatic No adneopathy. No adenopathy. No adenopathy. Musculoskeletal Adexa without tenderness or enlargement.. Digits and nails w/o clubbing, cyanosis, infection, petechiae, ischemia, or inflammatory conditions.Marland Kitchen Psychiatric Judgement and insight Intact.. No evidence of depression, anxiety, or agitation.. General Notes: the patient's wound is looking good with healthy granulation tissue and on the superior lateral port on the right side there is a smaller wound which is healthy too. Integumentary (Hair, Skin) No suspicious lesions. No crepitus or fluctuance. No peri-wound warmth or erythema. No masses.. Wound #2 status is Open. Original cause of wound was Pressure Injury. The wound  is located on the Medial Sacrum. The wound measures 5.4cm length x 5.5cm width x 0.3cm depth; 23.326cm^2 area and 6.998cm^3 volume. The wound is limited to skin breakdown. There is undermining starting at 9:00 and ending at 10:00 with a maximum distance of 0.4cm. There is a large amount of serosanguineous drainage noted. The wound margin is distinct with the outline attached to the wound base. There is large (67-100%) red granulation within the wound bed. There is no necrotic tissue within the wound bed. The periwound skin [name] [name], [name] [name] ([id number]) appearance exhibited: Moist. The periwound skin appearance did not exhibit: Callus, Crepitus, Excoriation, Fluctuance, Friable, Induration, Localized Edema, Rash, Scarring, Dry/Scaly, Maceration, Atrophie Blanche, Cyanosis, Ecchymosis, Hemosiderin Staining, Mottled, Pallor, Rubor, Erythema. Periwound temperature was noted as No Abnormality. Wound #3 status is Open. Original cause of wound was Pressure Injury. The wound is located on the Midline,Superior Sacrum. The wound measures 1.2cm length x 0.3cm width x 0.4cm depth; 0.283cm^2 area and 0.113cm^3 volume. The wound is limited to skin breakdown. There is no tunneling or undermining noted. There is a small  amount of serous drainage noted. The wound margin is epibole. There is large (67- 100%) pink granulation within the wound bed. The periwound skin appearance had no abnormalities noted for texture. The periwound skin appearance had no abnormalities noted for color. The periwound skin appearance exhibited: Moist. Periwound temperature was noted as No Abnormality. The periwound has tenderness on palpation. Assessment Active Problems ICD-10 L89.153 - Pressure ulcer of sacral region, stage 3 G81.90 - Hemiplegia, unspecified affecting unspecified side E44.1 - Mild protein-calorie malnutrition Plan Wound Cleansing: Wound #2 Medial Sacrum: Clean wound with Normal Saline. Wound #3 Midline,Superior Sacrum: Clean wound with Normal Saline. Skin Barriers/Peri-Wound Care: Wound #2 Medial Sacrum: Skin Prep Wound #3 Midline,Superior Sacrum: Skin Prep Primary Wound Dressing: Wound #2 Medial Sacrum: Aquacel Ag - at [facility]. Wound #3 Midline,Superior Sacrum: Prisma Ag - Cover with dry gauze and wound vac drape, change this dressing with wound vac changes at [facility] Secondary Dressing: [name] [name], [name] [name] ([id number]) Wound #2 Medial Sacrum: Boardered Foam Dressing - At [facility] Dressing Change Frequency: Wound #2 Medial Sacrum: Three times weekly Wound #3 Midline,Superior Sacrum: Three times weekly Follow-up Appointments: Wound #2 Medial Sacrum: Return Appointment in 2 [name]. Wound #3 Midline,Superior Sacrum: Return Appointment in 2 [name]. Negative Pressure Wound Therapy: Wound #2 Medial Sacrum: Wound VAC settings at 125/130 mmHg continuous pressure. Use BLACK/GREEN foam to wound cavity. Use WHITE foam to fill any tunnel/s and/or undermining. Change VAC dressing 3 X WEEK. Change canister as indicated when full. Nurse may titrate settings and frequency of dressing changes as clinically indicated. - To be applied by Skilled Nursing facility As discussed with the daughter who is at the bedside  we will continue with application of the wound VAC, and all other supportive care, local care including offloading. We will also continue with improving his nutrition and multivitamins and see him back on a regular basis. Electronic Signature(s) Signed: 06/12/2015 2:23:06 PM By: [name] [name] [name] [name] Entered By: [name] [name] on 06/12/2015 14:23:06 [name] [name], [name] [name] ([id number]) -------------------------------------------------------------------------------- SuperBill Details Patient Name: [name] [name], [name] [name] Date of Service: 06/12/2015 Medical Record Number: [id number] Patient Account Number: [id number] Date of Birth/Sex: 1931/02/19 (81 y.o. Male) Treating RN: [name] Sites Primary Care Physician: [name] Flake Other Clinician: Referring Physician: Audree Bane Treating Physician/Extender: [name] Re in Treatment: 7 Diagnosis Coding ICD-10 Codes Code Description [phone] Pressure ulcer of sacral region, stage 3 G81.90 Hemiplegia, unspecified affecting unspecified side E44.1 Mild protein-calorie  malnutrition Facility Procedures CPT4 Code: 16109604 Description: 99213 - WOUND CARE VISIT-LEV 3 EST PT Modifier: Quantity: 1 Physician Procedures CPT4 Code: [id number] Description: 99213 - WC PHYS LEVEL 3 - EST PT ICD-10 Description Diagnosis L89.153 Pressure ulcer of sacral region, stage 3 G81.90 Hemiplegia, unspecified affecting unspecified s E44.1 Mild protein-calorie malnutrition Modifier: ide Quantity: 1 Electronic Signature(s) Signed: 06/12/2015 2:23:50 PM By: [name] [name] [name] [name] Previous Signature: 06/12/2015 2:23:20 PM Version By: [name] [name] [name] [name] Entered By: [name] [name] on 06/12/2015 14:23:50

## 2015-06-26 ENCOUNTER — Encounter: Payer: Medicare Other | Admitting: Surgery

## 2015-06-26 DIAGNOSIS — L89153 Pressure ulcer of sacral region, stage 3: Secondary | ICD-10-CM | POA: Diagnosis not present

## 2015-06-27 NOTE — Progress Notes (Signed)
[name] [name], [name] [name] ([id number]) Visit Report for 06/26/2015 Chief Complaint Document Details Patient Name: [name] [name], [name] [name] Date of Service: 06/26/2015 9:45 AM Medical Record Number: [id number] Patient Account Number: [id number] Date of Birth/Sex: 11/05/1930 (81 y.o. Male) Treating RN: Curtis Sites Primary Care Physician: Christena Flake Other Clinician: Referring Physician: Christena Flake Treating Physician/Extender: Rudene Re in Treatment: 9 Information Obtained from: Caregiver Chief Complaint Patient is at the clinic for treatment of an open pressure ulcer. The patient comes along with his daughter who is the caregiver and lives in an assisted living facility and has had a ulcerative area on the sacrum for the last 6 months. Electronic Signature(s) Signed: 06/26/2015 11:02:05 AM By: Evlyn Kanner MD, FACS Entered By: Evlyn Kanner on 06/26/2015 11:02:05 Grossberg, Tomie ([id number]) -------------------------------------------------------------------------------- HPI Details Patient Name: [name] [name], [name] [name] Date of Service: 06/26/2015 9:45 AM Medical Record Number: [id number] Patient Account Number: [id number] Date of Birth/Sex: 1931-02-14 (81 y.o. Male) Treating RN: Curtis Sites Primary Care Physician: Christena Flake Other Clinician: Referring Physician: Christena Flake Treating Physician/Extender: Rudene Re in Treatment: 9 History of Present Illness Location: pressure injury to the sacral region and the left low back area. Quality: Patient reports experiencing a dull pain to affected area(s). Severity: Patient states wound are getting worse. Duration: Patient has had the wound for > 3 months prior to seeking treatment at the wound center Timing: Pain in wound is Intermittent (comes and goes Context: The wound appeared gradually over time Modifying Factors: Consults to this date include: wound VAC applications and local care Associated Signs and Symptoms: Wound has significant periowound  erythema and localized edema HPI Description: 81 year old gentleman who had a stroke in February of this year and has been bedbound since then. Since March he has developed a pressure injury and ulceration and has been treated at assisted living with a wound VAC and other supportive care. His had a PEG tube in place and though he eats some food during the day he gets supplementary PEG tube feeds between 6 PM and 6 AM. Has not had diabetes mellitus and was pretty active before his illness. The patient was seen here in July and after that had a problem where he was readmitted to hospital at [facility] and at that time treated with a wound VAC and then discharged to a skilled nursing facility. His plastic surgery appointment was canceled and he has not yet seen them. the nursing home has been changing his wound VAC. His nutrition has improved and is getting adequate tube feeds supplements and vitamins. 05/06/2015 -- was file was reviewed at the [facility] Department of reconstructive surgery and found to be unfit for surgery and reconstruction. His daughter confirms that they have not given him an appointment again. 05/26/2015 - he has not been here for about 3 weeks and today when he came here his wound VAC was not applied. 06/12/2015 -- the patient missed her last appointment as he was in the ER with a PEG tube which had come out and needed it to be replaced. He is doing fine otherwise. Electronic Signature(s) Signed: 06/26/2015 11:02:12 AM By: Evlyn Kanner MD, FACS Entered By: Evlyn Kanner on 06/26/2015 11:02:11 [name] [name], [name] [name] ([id number]) -------------------------------------------------------------------------------- Physical Exam Details Patient Name: [name] [name], [name] [name] Date of Service: 06/26/2015 9:45 AM Medical Record Number: [id number] Patient Account Number: [id number] Date of Birth/Sex: Dec 20, 1930 (81 y.o. Male) Treating RN: Curtis Sites Primary Care Physician: Christena Flake Other  Clinician: Referring Physician: Christena Flake Treating Physician/Extender: Rudene Re in Treatment: 9 Constitutional .  Pulse regular. Respirations normal and unlabored. Afebrile. . Eyes Nonicteric. Reactive to light. Ears, Nose, Mouth, and Throat Lips, teeth, and gums WNL.Marland Kitchen Moist mucosa without lesions . Neck supple and nontender. No palpable supraclavicular or cervical adenopathy. Normal sized without goiter. Respiratory WNL. No retractions.. Cardiovascular Pedal Pulses WNL. No clubbing, cyanosis or edema. Lymphatic No adneopathy. No adenopathy. No adenopathy. Musculoskeletal Adexa without tenderness or enlargement.. Digits and nails w/o clubbing, cyanosis, infection, petechiae, ischemia, or inflammatory conditions.. Integumentary (Hair, Skin) No suspicious lesions. No crepitus or fluctuance. No peri-wound warmth or erythema. No masses.Marland Kitchen Psychiatric Judgement and insight Intact.. No evidence of depression, anxiety, or agitation.. Notes the patient's larger wound is filled out nicely with healthy granulation tissue and the superior lateral part where there is a small ulcer is almost completely healed. Electronic Signature(s) Signed: 06/26/2015 11:02:45 AM By: Evlyn Kanner MD, FACS Entered By: Evlyn Kanner on 06/26/2015 11:02:44 [name] [name], [name] [name] ([id number]) -------------------------------------------------------------------------------- Physician Orders Details Patient Name: [name] [name], [name] [name] Date of Service: 06/26/2015 9:45 AM Medical Record Number: [id number] Patient Account Number: [id number] Date of Birth/Sex: 1930/10/09 (81 y.o. Male) Treating RN: Curtis Sites Primary Care Physician: Christena Flake Other Clinician: Referring Physician: Christena Flake Treating Physician/Extender: Rudene Re in Treatment: 9 Verbal / Phone Orders: Yes Clinician: Curtis Sites Read Back and Verified: Yes Diagnosis Coding Wound Cleansing Wound #2 Medial Sacrum o Clean wound with  Normal Saline. Wound #3 Midline,Superior Sacrum o Clean wound with Normal Saline. Skin Barriers/Peri-Wound Care Wound #2 Medial Sacrum o Skin Prep Wound #3 Midline,Superior Sacrum o Skin Prep Primary Wound Dressing Wound #2 Medial Sacrum o Prisma Ag - or collagen with silver equivalent under black foam with wound vac changes Wound #3 Midline,Superior Sacrum o Prisma Ag - Cover with dry gauze and wound vac drape, change this dressing with wound vac changes at [facility] Secondary Dressing Wound #2 Medial Sacrum o Boardered Foam Dressing - At [facility] Dressing Change Frequency Wound #2 Medial Sacrum o Three times weekly Wound #3 Midline,Superior Sacrum o Three times weekly Follow-up Appointments Wound #2 Medial Sacrum o Return Appointment in 2 weeks. [name] [name], [name] [name] ([id number]) Wound #3 Midline,Superior Sacrum o Return Appointment in 2 weeks. Negative Pressure Wound Therapy Wound #2 Medial Sacrum o Wound VAC settings at 125/130 mmHg continuous pressure. Use BLACK/GREEN foam to wound cavity. Use WHITE foam to fill any tunnel/s and/or undermining. Change VAC dressing 3 X WEEK. Change canister as indicated when full. Nurse may titrate settings and frequency of dressing changes as clinically indicated. - To be applied by Skilled Nursing facility Electronic Signature(s) Signed: 06/26/2015 4:24:56 PM By: Evlyn Kanner MD, FACS Signed: 06/26/2015 5:39:20 PM By: Curtis Sites Previous Signature: 06/26/2015 10:26:42 AM Version By: Curtis Sites Entered By: Curtis Sites on 06/26/2015 10:34:14 Ahuja, Damaso ([id number]) -------------------------------------------------------------------------------- Problem List Details Patient Name: [name] [name], [name] [name] Date of Service: 06/26/2015 9:45 AM Medical Record Number: [id number] Patient Account Number: [id number] Date of Birth/Sex: Oct 27, 1930 (81 y.o. Male) Treating RN: Curtis Sites Primary Care Physician: Christena Flake Other Clinician: Referring Physician: Christena Flake Treating Physician/Extender: Rudene Re in Treatment: 9 Active Problems ICD-10 Encounter Code Description Active Date Diagnosis L89.153 Pressure ulcer of sacral region, stage 3 04/22/2015 Yes G81.90 Hemiplegia, unspecified affecting unspecified side 04/22/2015 Yes E44.1 Mild protein-calorie malnutrition 04/22/2015 Yes Inactive Problems Resolved Problems Electronic Signature(s) Signed: 06/26/2015 11:01:56 AM By: Evlyn Kanner MD, FACS Entered By: Evlyn Kanner on 06/26/2015 11:01:56 [name] [name], Jayvion ([id number]) -------------------------------------------------------------------------------- Progress Note Details Patient Name: [name] [name], [name] [name] Date of Service: 06/26/2015 9:45 AM Medical Record  Number: [id number] Patient Account Number: [id number] Date of Birth/Sex: 05/07/1931 (81 y.o. Male) Treating RN: Curtis Sites Primary Care Physician: Christena Flake Other Clinician: Referring Physician: Christena Flake Treating Physician/Extender: Rudene Re in Treatment: 9 Subjective Chief Complaint Information obtained from Caregiver Patient is at the clinic for treatment of an open pressure ulcer. The patient comes along with his daughter who is the caregiver and lives in an assisted living facility and has had a ulcerative area on the sacrum for the last 6 months. History of Present Illness (HPI) The following HPI elements were documented for the patient's wound: Location: pressure injury to the sacral region and the left low back area. Quality: Patient reports experiencing a dull pain to affected area(s). Severity: Patient states wound are getting worse. Duration: Patient has had the wound for > 3 months prior to seeking treatment at the wound center Timing: Pain in wound is Intermittent (comes and goes Context: The wound appeared gradually over time Modifying Factors: Consults to this date include: wound VAC applications  and local care Associated Signs and Symptoms: Wound has significant periowound erythema and localized edema 81 year old gentleman who had a stroke in February of this year and has been bedbound since then. Since March he has developed a pressure injury and ulceration and has been treated at assisted living with a wound VAC and other supportive care. His had a PEG tube in place and though he eats some food during the day he gets supplementary PEG tube feeds between 6 PM and 6 AM. Has not had diabetes mellitus and was pretty active before his illness. The patient was seen here in July and after that had a problem where he was readmitted to hospital at [facility] and at that time treated with a wound VAC and then discharged to a skilled nursing facility. His plastic surgery appointment was canceled and he has not yet seen them. the nursing home has been changing his wound VAC. His nutrition has improved and is getting adequate tube feeds supplements and vitamins. 05/06/2015 -- was file was reviewed at the [facility] Department of reconstructive surgery and found to be unfit for surgery and reconstruction. His daughter confirms that they have not given him an appointment again. 05/26/2015 - he has not been here for about 3 weeks and today when he came here his wound VAC was not applied. 06/12/2015 -- the patient missed her last appointment as he was in the ER with a PEG tube which had come out and needed it to be replaced. He is doing fine otherwise. [name] [name], [name] [name] ([id number]) Objective Constitutional Pulse regular. Respirations normal and unlabored. Afebrile. Vitals Time Taken: 10:12 AM, Temperature: 98.5 F, Pulse: 95 bpm, Respiratory Rate: 14 breaths/min, Blood Pressure: 110/56 mmHg. Eyes Nonicteric. Reactive to light. Ears, Nose, Mouth, and Throat Lips, teeth, and gums WNL.Marland Kitchen Moist mucosa without lesions . Neck supple and nontender. No palpable supraclavicular or cervical adenopathy.  Normal sized without goiter. Respiratory WNL. No retractions.. Cardiovascular Pedal Pulses WNL. No clubbing, cyanosis or edema. Lymphatic No adneopathy. No adenopathy. No adenopathy. Musculoskeletal Adexa without tenderness or enlargement.. Digits and nails w/o clubbing, cyanosis, infection, petechiae, ischemia, or inflammatory conditions.Marland Kitchen Psychiatric Judgement and insight Intact.. No evidence of depression, anxiety, or agitation.. General Notes: the patient's larger wound is filled out nicely with healthy granulation tissue and the superior lateral part where there is a small ulcer is almost completely healed. Integumentary (Hair, Skin) No suspicious lesions. No crepitus or fluctuance. No peri-wound warmth or erythema. No  masses.. Wound #2 status is Open. Original cause of wound was Pressure Injury. The wound is located on the Medial Sacrum. The wound measures 4.8cm length x 4.7cm width x 0.3cm depth; 17.719cm^2 area and 5.316cm^3 volume. The wound is limited to skin breakdown. There is no tunneling or undermining noted. There is a [name] [name] amount of serosanguineous drainage noted. The wound margin is distinct with the outline attached to the wound base. There is [name] [name] (67-100%) red granulation within the wound bed. There is no necrotic tissue within the wound bed. The periwound skin appearance exhibited: Moist. The periwound skin [name] [name], [name] [name] ([id number]) appearance did not exhibit: Callus, Crepitus, Excoriation, Fluctuance, Friable, Induration, Localized Edema, Rash, Scarring, Dry/Scaly, Maceration, Atrophie Blanche, Cyanosis, Ecchymosis, Hemosiderin Staining, Mottled, Pallor, Rubor, Erythema. Periwound temperature was noted as No Abnormality. Wound #3 status is Open. Original cause of wound was Pressure Injury. The wound is located on the Midline,Superior Sacrum. The wound measures 1cm length x 0.2cm width x 0.2cm depth; 0.157cm^2 area and 0.031cm^3 volume. The wound is limited to skin  breakdown. There is no tunneling or undermining noted. There is a small amount of serous drainage noted. The wound margin is epibole. There is [name] [name] (67- 100%) pink granulation within the wound bed. The periwound skin appearance had no abnormalities noted for texture. The periwound skin appearance had no abnormalities noted for color. The periwound skin appearance exhibited: Moist. Periwound temperature was noted as No Abnormality. The periwound has tenderness on palpation. Assessment Active Problems ICD-10 L89.153 - Pressure ulcer of sacral region, stage 3 G81.90 - Hemiplegia, unspecified affecting unspecified side E44.1 - Mild protein-calorie malnutrition We will continue with the wound VAC for another week but this time around we will apply some collagen under the black foam and continue with local offloading and nutrition and vitamin support. Plan Wound Cleansing: Wound #2 Medial Sacrum: Clean wound with Normal Saline. Wound #3 Midline,Superior Sacrum: Clean wound with Normal Saline. Skin Barriers/Peri-Wound Care: Wound #2 Medial Sacrum: Skin Prep Wound #3 Midline,Superior Sacrum: Skin Prep Primary Wound Dressing: Wound #2 Medial Sacrum: Prisma Ag - or collagen with silver equivalent under black foam with wound vac changes Wound #3 Midline,Superior Sacrum: [name] [name], [name] [name] ([id number]) Prisma Ag - Cover with dry gauze and wound vac drape, change this dressing with wound vac changes at [facility] Secondary Dressing: Wound #2 Medial Sacrum: Boardered Foam [facility] Change Frequency: Wound #2 Medial Sacrum: Three times weekly Wound #3 Midline,Superior Sacrum: Three times weekly Follow-up Appointments: Wound #2 Medial Sacrum: Return Appointment in 2 weeks. Wound #3 Midline,Superior Sacrum: Return Appointment in 2 weeks. Negative Pressure Wound Therapy: Wound #2 Medial Sacrum: Wound VAC settings at 125/130 mmHg continuous pressure. Use BLACK/GREEN  foam to wound cavity. Use WHITE foam to fill any tunnel/s and/or undermining. Change VAC dressing 3 X WEEK. Change canister as indicated when full. Nurse may titrate settings and frequency of dressing changes as clinically indicated. - To be applied by Skilled Nursing facility We will continue with the wound VAC for another week but this time around we will apply some collagen under the black foam and continue with local offloading and nutrition and vitamin support. Electronic Signature(s) Signed: 06/26/2015 11:03:32 AM By: Evlyn KannerBritto, Laneta Guerin MD, FACS Entered By: Evlyn KannerBritto, Intisar Claudio on 06/26/2015 11:03:32 [name] [name], [name] [name] ([id number]) -------------------------------------------------------------------------------- SuperBill Details Patient Name: [name] [name], [name] [name] Date of Service: 06/26/2015 Medical Record Number: [id number] Patient Account Number: [id number] Date of Birth/Sex: 11/05/1930 37(81 y.o. Male) Treating RN: Curtis Sitesorthy, Joanna Primary Care Physician: Christena FlakeARD, JOHN Other  Clinician: Referring Physician: Christena Flake Treating Physician/Extender: Rudene Re in Treatment: 9 Diagnosis Coding ICD-10 Codes Code Description L89.153 Pressure ulcer of sacral region, stage 3 G81.90 Hemiplegia, unspecified affecting unspecified side E44.1 Mild protein-calorie malnutrition Facility Procedures CPT4 Code: 16109604 Description: 99213 - WOUND CARE VISIT-LEV 3 EST PT Modifier: Quantity: 1 Physician Procedures CPT4 Code: 5409811 Description: 99213 - WC PHYS LEVEL 3 - EST PT ICD-10 Description Diagnosis L89.153 Pressure ulcer of sacral region, stage 3 G81.90 Hemiplegia, unspecified affecting unspecified s E44.1 Mild protein-calorie malnutrition Modifier: ide Quantity: 1 Electronic Signature(s) Signed: 06/26/2015 2:35:33 PM By: Curtis Sites Signed: 06/26/2015 4:24:56 PM By: Evlyn Kanner MD, FACS Previous Signature: 06/26/2015 11:03:45 AM Version By: Evlyn Kanner MD, FACS Entered By: Curtis Sites on  06/26/2015 14:35:33

## 2015-06-27 NOTE — Progress Notes (Signed)
Vantassell, Bernabe ([id number]) Visit Report for 06/26/2015 Arrival Information Details Patient Name: Leaman, Yobani Date of Service: 06/26/2015 9:45 AM Medical Record Number: [id number] Patient Account Number: [id number] Date of Birth/Sex: 08/08/1930 34(81 y.o. Male) Treating RN: Curtis Sitesorthy, Joanna Primary Care Physician: Christena FlakeARD, JOHN Other Clinician: Referring Physician: Christena FlakeARD, JOHN Treating Physician/Extender: Rudene ReBritto, Errol Weeks in Treatment: 9 Visit Information History Since Last Visit Added or deleted any medications: No Patient Arrived: Stretcher Any new allergies or adverse reactions: No Arrival Time: 10:01 Had a fall or experienced change in No Accompanied By: dtr activities of daily living that may affect Transfer Assistance: Stretcher risk of falls: Patient Identification Verified: Yes Signs or symptoms of abuse/neglect since last No Secondary Verification Process Yes visito Completed: Hospitalized since last visit: No Patient Requires Transmission-Based No Pain Present Now: No Precautions: Patient Has Alerts: No Electronic Signature(s) Signed: 06/26/2015 5:39:20 PM By: Curtis Sitesorthy, Joanna Entered By: Curtis Sitesorthy, Joanna on 06/26/2015 10:01:53 Bumgarner, Hussain ([id number]) -------------------------------------------------------------------------------- Clinic Level of Care Assessment Details Patient Name: Rebuck, Adalberto Date of Service: 06/26/2015 9:45 AM Medical Record Number: [id number] Patient Account Number: [id number] Date of Birth/Sex: 04/19/1931 2(81 y.o. Male) Treating RN: Curtis Sitesorthy, Joanna Primary Care Physician: Christena FlakeARD, JOHN Other Clinician: Referring Physician: Christena FlakeARD, JOHN Treating Physician/Extender: Rudene ReBritto, Errol Weeks in Treatment: 9 Clinic Level of Care Assessment Items TOOL 4 Quantity Score []  - Use when only an EandM is performed on FOLLOW-UP visit 0 ASSESSMENTS - Nursing Assessment / Reassessment X - Reassessment of Co-morbidities (includes updates in patient status) 1 10 X -  Reassessment of Adherence to Treatment Plan 1 5 ASSESSMENTS - Wound and Skin Assessment / Reassessment []  - Simple Wound Assessment / Reassessment - one wound 0 X - Complex Wound Assessment / Reassessment - multiple wounds 2 5 []  - Dermatologic / Skin Assessment (not related to wound area) 0 ASSESSMENTS - Focused Assessment []  - Circumferential Edema Measurements - multi extremities 0 []  - Nutritional Assessment / Counseling / Intervention 0 []  - Lower Extremity Assessment (monofilament, tuning fork, pulses) 0 []  - Peripheral Arterial Disease Assessment (using hand held doppler) 0 ASSESSMENTS - Ostomy and/or Continence Assessment and Care []  - Incontinence Assessment and Management 0 []  - Ostomy Care Assessment and Management (repouching, etc.) 0 PROCESS - Coordination of Care X - Simple Patient / Family Education for ongoing care 1 15 []  - Complex (extensive) Patient / Family Education for ongoing care 0 []  - Staff obtains ChiropractorConsents, Records, Test Results / Process Orders 0 []  - Staff telephones HHA, Nursing Homes / Clarify orders / etc 0 []  - Routine Transfer to another Facility (non-emergent condition) 0 Marcucci, Yarnell ([id number]) []  - Routine Hospital Admission (non-emergent condition) 0 []  - New Admissions / Manufacturing engineernsurance Authorizations / Ordering NPWT, Apligraf, etc. 0 []  - Emergency Hospital Admission (emergent condition) 0 X - Simple Discharge Coordination 1 10 []  - Complex (extensive) Discharge Coordination 0 PROCESS - Special Needs []  - Pediatric / Minor Patient Management 0 []  - Isolation Patient Management 0 []  - Hearing / Language / Visual special needs 0 []  - Assessment of Community assistance (transportation, D/C planning, etc.) 0 []  - Additional assistance / Altered mentation 0 []  - Support Surface(s) Assessment (bed, cushion, seat, etc.) 0 INTERVENTIONS - Wound Cleansing / Measurement []  - Simple Wound Cleansing - one wound 0 X - Complex Wound Cleansing - multiple wounds  2 5 X - Wound Imaging (photographs - any number of wounds) 1 5 []  - Wound Tracing (instead of photographs) 0 []  - Simple Wound Measurement - one  wound 0 X - Complex Wound Measurement - multiple wounds 2 5 INTERVENTIONS - Wound Dressings X - Small Wound Dressing one or multiple wounds 2 10  - Medium Wound Dressing one or multiple wounds 0  - Large Wound Dressing one or multiple wounds 0  - Application of Medications - topical 0  - Application of Medications - injection 0 INTERVENTIONS - Miscellaneous  - External ear exam 0 Froman, Deiontae ([id number])  - Specimen Collection (cultures, biopsies, blood, body fluids, etc.) 0  - Specimen(s) / Culture(s) sent or taken to Lab for analysis 0  - Patient Transfer (multiple staff / Michiel Sites Lift / Similar devices) 0  - Simple Staple / Suture removal (25 or less) 0  - Complex Staple / Suture removal (26 or more) 0  - Hypo / Hyperglycemic Management (close monitor of Blood Glucose) 0  - Ankle / Brachial Index (ABI) - do not check if billed separately 0 X - Vital Signs 1 5 Has the patient been seen at the hospital within the last three years: Yes Total Score: 100 Level Of Care: New/Established - Level 3 Electronic Signature(s) Signed: 06/26/2015 2:35:25 PM By: Curtis Sites Entered By: Curtis Sites on 06/26/2015 14:35:24 Berrios, Edrick ([id number]) -------------------------------------------------------------------------------- Encounter Discharge Information Details Patient Name: Snead, Alexanderjames Date of Service: 06/26/2015 9:45 AM Medical Record Number: [id number] Patient Account Number: [id number] Date of Birth/Sex: February 25, 1931 (81 y.o. Male) Treating RN: Curtis Sites Primary Care Physician: Christena Flake Other Clinician: Referring Physician: Christena Flake Treating Physician/Extender: Rudene Re in Treatment: 9 Encounter Discharge Information Items Discharge Pain Level: 0 Discharge Condition: Stable Ambulatory  Status: Stretcher Discharge Destination: Nursing Home Transportation: Ambulance Accompanied By: ems and dtr Schedule Follow-up Appointment: Yes Medication Reconciliation completed and provided to Patient/Care No Bonney Berres: Provided on Clinical Summary of Care: 06/26/2015 Form Type Recipient Paper Patient OS Electronic Signature(s) Signed: 06/26/2015 11:21:53 AM By: Curtis Sites Previous Signature: 06/26/2015 10:57:20 AM Version By: Gwenlyn Perking Entered By: Curtis Sites on 06/26/2015 11:21:52 Olkowski, Moustafa ([id number]) -------------------------------------------------------------------------------- Multi Wound Chart Details Patient Name: Cowin, Hiren Date of Service: 06/26/2015 9:45 AM Medical Record Number: [id number] Patient Account Number: [id number] Date of Birth/Sex: 02-08-1931 (81 y.o. Male) Treating RN: Curtis Sites Primary Care Physician: Christena Flake Other Clinician: Referring Physician: Christena Flake Treating Physician/Extender: Rudene Re in Treatment: 9 Vital Signs Height(in): Pulse(bpm): 95 Weight(lbs): Blood Pressure 110/56 (mmHg): Body Mass Index(BMI): Temperature(F): 98.5 Respiratory Rate 14 (breaths/min): Photos: [2:No Photos] [3:No Photos] [N/A:N/A] Wound Location: [2:Sacrum - Medial] [3:Sacrum - Midline, Superior] [N/A:N/A] Wounding Event: [2:Pressure Injury] [3:Pressure Injury] [N/A:N/A] Primary Etiology: [2:Pressure Ulcer] [3:Pressure Ulcer] [N/A:N/A] Comorbid History: [2:Cataracts, Glaucoma, Anemia, Hypertension, Osteoarthritis] [3:Cataracts, Glaucoma, Anemia, Hypertension, Osteoarthritis] [N/A:N/A] Date Acquired: [2:12/09/2014] [3:12/16/2014] [N/A:N/A] Weeks of Treatment: [2:9] [3:2] [N/A:N/A] Wound Status: [2:Open] [3:Open] [N/A:N/A] Measurements L x W x D 4.8x4.7x0.3 [3:1x0.2x0.2] [N/A:N/A] (cm) Area (cm) : [2:17.719] [3:0.157] [N/A:N/A] Volume (cm) : [2:5.316] [3:0.031] [N/A:N/A] % Reduction in Area: [2:66.50%] [3:44.50%]  [N/A:N/A] % Reduction in Volume: 49.80% [3:72.60%] [N/A:N/A] 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] [3:Category/Stage III] [N/A:N/A] Exudate Amount: [2:Large] [3:Small] [N/A:N/A] Exudate Type: [2:Serosanguineous] [3:Serous] [N/A:N/A] Exudate Color: [2:red, brown] [3:amber] [N/A:N/A] Foul Odor After [2:Yes] [3:Yes] [N/A:N/A] Cleansing: Odor Anticipated Due to No [3:No] [N/A:N/A] Product Use: Wound Margin: [2:Distinct, outline attached] [3:Epibole] [N/A:N/A] Granulation Amount: [2:Large (67-100%)] [3:Large (67-100%)] [N/A:N/A] Granulation Quality: [2:Red] [3:Pink] [N/A:N/A] Necrotic Amount: [2:None Present (0%)] [3:N/A] [N/A:N/A] Exposed Structures: [N/A:N/A] Fascia: No Fascia: No Fat: No Fat: No Tendon: No Tendon: No Muscle: No Muscle: No Joint: No Joint: No Bone: No  Bone: No Limited to Skin Limited to Skin Breakdown Breakdown Epithelialization: None None N/A Periwound Skin Texture: Edema: No No Abnormalities Noted N/A Excoriation: No Induration: No Callus: No Crepitus: No Fluctuance: No Friable: No Rash: No Scarring: No Periwound Skin Moist: Yes Moist: Yes N/A Moisture: Maceration: No Dry/Scaly: No Periwound Skin Color: Atrophie Blanche: No No Abnormalities Noted N/A Cyanosis: No Ecchymosis: No Erythema: No Hemosiderin Staining: No Mottled: No Pallor: No Rubor: No Temperature: No Abnormality No Abnormality N/A Tenderness on No Yes N/A Palpation: Wound Preparation: Ulcer Cleansing: Ulcer Cleansing: N/A Rinsed/Irrigated with Rinsed/Irrigated with Saline Saline Topical Anesthetic Topical Anesthetic Applied: None Applied: None Treatment Notes Electronic Signature(s) Signed: 06/26/2015 10:26:00 AM By: Curtis Sites Entered By: Curtis Sites on 06/26/2015 10:26:00 Dobransky, Stelios ([id number]) -------------------------------------------------------------------------------- Multi-Disciplinary Care Plan Details Patient Name: Wingrove, Jahki Date of Service:  06/26/2015 9:45 AM Medical Record Number: [id number] Patient Account Number: [id number] Date of Birth/Sex: 12/08/1930 (81 y.o. Male) Treating RN: Curtis Sites Primary Care Physician: Christena Flake Other Clinician: Referring Physician: Christena Flake Treating Physician/Extender: Rudene Re in Treatment: 9 Active Inactive Orientation to the Wound Care Program Nursing Diagnoses: Knowledge deficit related to the wound healing center program Goals: Patient/caregiver will verbalize understanding of the Wound Healing Center Program Date Initiated: 05/26/2015 Goal Status: Active Interventions: Provide education on orientation to the wound center Notes: Wound/Skin Impairment Nursing Diagnoses: Impaired tissue integrity Goals: Ulcer/skin breakdown will heal within 14 weeks Date Initiated: 05/26/2015 Goal Status: Active Interventions: Provide education on ulcer and skin care Notes: Electronic Signature(s) Signed: 06/26/2015 10:25:52 AM By: Curtis Sites Entered By: Curtis Sites on 06/26/2015 10:25:52 Cofer, Khani ([id number]) -------------------------------------------------------------------------------- Patient/Caregiver Education Details Patient Name: Kohlman, Quatavious Date of Service: 06/26/2015 9:45 AM Medical Record Number: [id number] Patient Account Number: [id number] Date of Birth/Gender: [birth year] (81 y.o. Male) Treating RN: Curtis Sites Primary Care Physician: Christena Flake Other Clinician: Referring Physician: Christena Flake Treating Physician/Extender: Rudene Re in Treatment: 9 Education Assessment Education Provided To: Patient and Caregiver Education Topics Provided Pressure: Handouts: Preventing Pressure Ulcers Methods: Explain/Verbal Responses: State content correctly Electronic Signature(s) Signed: 06/26/2015 10:27:09 AM By: Curtis Sites Entered By: Curtis Sites on 06/26/2015 10:27:09 Losey, Smitty  ([id number]) -------------------------------------------------------------------------------- Wound Assessment Details Patient Name: Kresse, Jaydien Date of Service: 06/26/2015 9:45 AM Medical Record Number: [id number] Patient Account Number: [id number] Date of Birth/Sex: 1931-02-23 (81 y.o. Male) Treating RN: Curtis Sites Primary Care Physician: Christena Flake Other Clinician: Referring Physician: Christena Flake Treating Physician/Extender: Rudene Re in Treatment: 9 Wound Status Wound Number: 2 Primary Pressure Ulcer Etiology: Wound Location: Sacrum - Medial Wound Open Wounding Event: Pressure Injury Status: Date Acquired: 12/09/2014 Comorbid Cataracts, Glaucoma, Anemia, Weeks Of Treatment: 9 History: Hypertension, Osteoarthritis Clustered Wound: No Photos Photo Uploaded By: Curtis Sites on 06/26/2015 16:37:42 Wound Measurements Length: (cm) 4.8 Width: (cm) 4.7 Depth: (cm) 0.3 Area: (cm) 17.719 Volume: (cm) 5.316 % Reduction in Area: 66.5% % Reduction in Volume: 49.8% Epithelialization: None Tunneling: No Undermining: No Wound Description Classification: Category/Stage IV Foul Odor Af Wound Margin: Distinct, outline attached Due to Produ Exudate Amount: Large Exudate Type: Serosanguineous Exudate Color: red, brown ter Cleansing: Yes ct Use: No Wound Bed Granulation Amount: Large (67-100%) Exposed Structure Granulation Quality: Red Fascia Exposed: No Necrotic Amount: None Present (0%) Fat Layer Exposed: No Tendon Exposed: No Stencel, Aristides ([id number]) Muscle Exposed: No Joint Exposed: No Bone Exposed: No Limited to Skin Breakdown Periwound Skin Texture Texture Color No Abnormalities Noted: No No Abnormalities Noted: No Callus: No Atrophie Blanche: No Crepitus: No Cyanosis:  No Excoriation: No Ecchymosis: No Fluctuance: No Erythema: No Friable: No Hemosiderin Staining: No Induration: No Mottled: No Localized Edema: No Pallor: No Rash:  No Rubor: No Scarring: No Temperature / Pain Moisture Temperature: No Abnormality No Abnormalities Noted: No Dry / Scaly: No Maceration: No Moist: Yes Wound Preparation Ulcer Cleansing: Rinsed/Irrigated with Saline Topical Anesthetic Applied: None Treatment Notes Wound #2 (Medial Sacrum) 1. Cleansed with: Clean wound with Normal Saline 3. Peri-wound Care: Skin Prep 4. Dressing Applied: Prisma Ag 5. Secondary Dressing Applied Bordered Foam Dressing Electronic Signature(s) Signed: 06/26/2015 10:25:34 AM By: Curtis Sites Entered By: Curtis Sites on 06/26/2015 10:25:34 Guiles, Cire ([id number]) -------------------------------------------------------------------------------- Wound Assessment Details Patient Name: Kanode, Quientin Date of Service: 06/26/2015 9:45 AM Medical Record Number: [id number] Patient Account Number: [id number] Date of Birth/Sex: 1931-05-14 (81 y.o. Male) Treating RN: Curtis Sites Primary Care Physician: Christena Flake Other Clinician: Referring Physician: Christena Flake Treating Physician/Extender: Rudene Re in Treatment: 9 Wound Status Wound Number: 3 Primary Pressure Ulcer Etiology: Wound Location: Sacrum - Midline, Superior Wound Open Wounding Event: Pressure Injury Status: Date Acquired: 12/16/2014 Comorbid Cataracts, Glaucoma, Anemia, Weeks Of Treatment: 2 History: Hypertension, Osteoarthritis Clustered Wound: No Photos Photo Uploaded By: Curtis Sites on 06/26/2015 16:38:04 Wound Measurements Length: (cm) 1 Width: (cm) 0.2 Depth: (cm) 0.2 Area: (cm) 0.157 Volume: (cm) 0.031 % Reduction in Area: 44.5% % Reduction in Volume: 72.6% Epithelialization: None Tunneling: No Undermining: No Wound Description Classification: Category/Stage III Wound Margin: Epibole Exudate Amount: Small Exudate Type: Serous Exudate Color: amber Foul Odor After Cleansing: Yes Due to Product Use: No Wound Bed Granulation Amount: Large (67-100%)  Exposed Structure Granulation Quality: Pink Fascia Exposed: No Fat Layer Exposed: No Tendon Exposed: No Autrey, Dunbar ([id number]) Muscle Exposed: No Joint Exposed: No Bone Exposed: No Limited to Skin Breakdown Periwound Skin Texture Texture Color No Abnormalities Noted: Yes No Abnormalities Noted: Yes Moisture Temperature / Pain No Abnormalities Noted: No Temperature: No Abnormality Moist: Yes Tenderness on Palpation: Yes Wound Preparation Ulcer Cleansing: Rinsed/Irrigated with Saline Topical Anesthetic Applied: None Treatment Notes Wound #3 (Midline, Superior Sacrum) 1. Cleansed with: Clean wound with Normal Saline 3. Peri-wound Care: Skin Prep 4. Dressing Applied: Prisma Ag 5. Secondary Dressing Applied Bordered Foam Dressing Electronic Signature(s) Signed: 06/26/2015 10:25:46 AM By: Curtis Sites Entered By: Curtis Sites on 06/26/2015 10:25:46 Patchell, Drue ([id number]) -------------------------------------------------------------------------------- Vitals Details Patient Name: Yaw, Westly Date of Service: 06/26/2015 9:45 AM Medical Record Number: [id number] Patient Account Number: [id number] Date of Birth/Sex: 1931/06/10 (81 y.o. Male) Treating RN: Curtis Sites Primary Care Physician: Christena Flake Other Clinician: Referring Physician: Christena Flake Treating Physician/Extender: Rudene Re in Treatment: 9 Vital Signs Time Taken: 10:12 Temperature (F): 98.5 Pulse (bpm): 95 Respiratory Rate (breaths/min): 14 Blood Pressure (mmHg): 110/56 Reference Range: 80 - 120 mg / dl Electronic Signature(s) Signed: 06/26/2015 5:39:20 PM By: Curtis Sites Entered By: Curtis Sites on 06/26/2015 10:11:54

## 2015-07-10 ENCOUNTER — Encounter (HOSPITAL_BASED_OUTPATIENT_CLINIC_OR_DEPARTMENT_OTHER): Payer: Medicare Other | Admitting: General Surgery

## 2015-07-10 DIAGNOSIS — L89153 Pressure ulcer of sacral region, stage 3: Secondary | ICD-10-CM | POA: Diagnosis not present

## 2015-07-10 NOTE — Progress Notes (Signed)
seeiheal 

## 2015-07-11 NOTE — Progress Notes (Signed)
[name] [name] ([id number]) Visit Report for 07/10/2015 Chief Complaint Document Details Patient Name: [name] [name], [name] [name] Date of Service: 07/10/2015 10:00 AM Medical Record Number: [id number] Patient Account Number: [id number] Date of Birth/Sex: 24-Jun-1931 (81 y.o. Male) Treating RN: Curtis Sites Primary Care Physician: Christena Flake Other Clinician: Referring Physician: Christena Flake Treating Physician/Extender: Elayne Snare in Treatment: 11 Information Obtained from: Caregiver Chief Complaint Patient is at the clinic for treatment of an open pressure ulcer. The patient comes along with his daughter who is the caregiver and lives in an assisted living facility and has had a ulcerative area on the sacrum for the last 6 months. Electronic Signature(s) Signed: 07/10/2015 11:13:52 AM By: Ardath Sax MD Entered By: Ardath Sax on 07/10/2015 11:13:51 Mckinnie, Exodus ([id number]) -------------------------------------------------------------------------------- HPI Details Patient Name: [name] [name] Date of Service: 07/10/2015 10:00 AM Medical Record Number: [id number] Patient Account Number: [id number] Date of Birth/Sex: January 30, 1931 (81 y.o. Male) Treating RN: Curtis Sites Primary Care Physician: Christena Flake Other Clinician: Referring Physician: Christena Flake Treating Physician/Extender: Elayne Snare in Treatment: 11 History of Present Illness Location: pressure injury to the sacral region and the left low back area. Quality: Patient reports experiencing a dull pain to affected area(s). Severity: Patient states wound are getting worse. Duration: Patient has had the wound for > 3 months prior to seeking treatment at the wound center Timing: Pain in wound is Intermittent (comes and goes Context: The wound appeared gradually over time Modifying Factors: Consults to this date include: wound VAC applications and local care Associated Signs and Symptoms: Wound has significant periowound  erythema and localized edema HPI Description: 81 year old gentleman who had a stroke in February of this year and has been bedbound since then. Since March he has developed a pressure injury and ulceration and has been treated at assisted living with a wound VAC and other supportive care. His had a PEG tube in place and though he eats some food during the day he gets supplementary PEG tube feeds between 6 PM and 6 AM. Has not had diabetes mellitus and was pretty active before his illness. The patient was seen here in July and after that had a problem where he was readmitted to hospital at [facility] and at that time treated with a wound VAC and then discharged to a skilled nursing facility. His plastic surgery appointment was canceled and he has not yet seen them. the nursing home has been changing his wound VAC. His nutrition has improved and is getting adequate tube feeds supplements and vitamins. 05/06/2015 -- was file was reviewed at the [facility] Department of reconstructive surgery and found to be unfit for surgery and reconstruction. His daughter confirms that they have not given him an appointment again. 05/26/2015 - he has not been here for about 3 weeks and today when he came here his wound VAC was not applied. 06/12/2015 -- the patient missed her last appointment as he was in the ER with a PEG tube which had come out and needed it to be replaced. He is doing fine otherwise. Electronic Signature(s) Signed: 07/10/2015 11:14:03 AM By: Ardath Sax MD Entered By: Ardath Sax on 07/10/2015 11:14:03 Lowe, Ousman ([id number]) -------------------------------------------------------------------------------- Physical Exam Details Patient Name: [name] [name] Date of Service: 07/10/2015 10:00 AM Medical Record Number: [id number] Patient Account Number: [id number] Date of Birth/Sex: [birth year] (81 y.o. Male) Treating RN: Curtis Sites Primary Care Physician: Christena Flake Other  Clinician: Referring Physician: Christena Flake Treating Physician/Extender: Elayne Snare in Treatment: 11 Electronic Signature(s) Signed: 07/10/2015  11:14:11 AM By: Ardath Sax MD Entered By: Ardath Sax on 07/10/2015 11:14:10 Wurster, Aspen ([id number]) -------------------------------------------------------------------------------- Physician Orders Details Patient Name: [name] [name] Date of Service: 07/10/2015 10:00 AM Medical Record Number: [id number] Patient Account Number: [id number] Date of Birth/Sex: [birth year] (81 y.o. Male) Treating RN: Curtis Sites Primary Care Physician: Christena Flake Other Clinician: Referring Physician: Christena Flake Treating Physician/Extender: Elayne Snare in Treatment: 58 Verbal / Phone Orders: Yes Clinician: Curtis Sites Read Back and Verified: Yes Diagnosis Coding Wound Cleansing Wound #2 Medial Sacrum o Clean wound with Normal Saline. Wound #3 Midline,Superior Sacrum o Clean wound with Normal Saline. Skin Barriers/Peri-Wound Care Wound #2 Medial Sacrum o Skin Prep Wound #3 Midline,Superior Sacrum o Skin Prep Primary Wound Dressing Wound #2 Medial Sacrum o Prisma Ag - or collagen with silver equivalent under black foam with wound vac changes Wound #3 Midline,Superior Sacrum o Prisma Ag - Cover with dry gauze and wound vac drape, change this dressing with wound vac changes at [facility] Secondary Dressing Wound #2 Medial Sacrum o Boardered Foam Dressing - At [facility] Wound #3 Midline,Superior Sacrum o Boardered Foam Dressing - At [facility] Dressing Change Frequency Wound #2 Medial Sacrum o Three times weekly Wound #3 Midline,Superior Sacrum o Three times weekly Vetsch, Nolawi ([id number]) Follow-up Appointments Wound #2 Medial Sacrum o Return Appointment in 2 weeks. Wound #3 Midline,Superior Sacrum o Return Appointment in 2 weeks. Negative Pressure Wound Therapy Wound #2 Medial Sacrum o Wound  VAC settings at 125/130 mmHg continuous pressure. Use BLACK/GREEN foam to wound cavity. Use WHITE foam to fill any tunnel/s and/or undermining. Change VAC dressing 3 X WEEK. Change canister as indicated when full. Nurse may titrate settings and frequency of dressing changes as clinically indicated. - To be applied by Skilled Nursing facility Electronic Signature(s) Signed: 07/10/2015 4:57:31 PM By: Curtis Sites Signed: 07/11/2015 8:01:58 AM By: Ardath Sax MD Entered By: Curtis Sites on 07/10/2015 10:34:05 Lumbert, Mayfield ([id number]) -------------------------------------------------------------------------------- Problem List Details Patient Name: Drier, Clearnce Date of Service: 07/10/2015 10:00 AM Medical Record Number: [id number] Patient Account Number: [id number] Date of Birth/Sex: 08-26-1930 (81 y.o. Male) Treating RN: Curtis Sites Primary Care Physician: Christena Flake Other Clinician: Referring Physician: Christena Flake Treating Physician/Extender: Elayne Snare in Treatment: 11 Active Problems ICD-10 Encounter Code Description Active Date Diagnosis L89.153 Pressure ulcer of sacral region, stage 3 04/22/2015 Yes G81.90 Hemiplegia, unspecified affecting unspecified side 04/22/2015 Yes E44.1 Mild protein-calorie malnutrition 04/22/2015 Yes Inactive Problems Resolved Problems Electronic Signature(s) Signed: 07/10/2015 11:13:41 AM By: Ardath Sax MD Entered By: Ardath Sax on 07/10/2015 11:13:41 Khouri, Jimi ([id number]) -------------------------------------------------------------------------------- Progress Note Details Patient Name: Teng, Colen Date of Service: 07/10/2015 10:00 AM Medical Record Number: [id number] Patient Account Number: [id number] Date of Birth/Sex: [birth year] (81 y.o. Male) Treating RN: Curtis Sites Primary Care Physician: Christena Flake Other Clinician: Referring Physician: Christena Flake Treating Physician/Extender: Elayne Snare in  Treatment: 11 Subjective Chief Complaint Information obtained from Caregiver Patient is at the clinic for treatment of an open pressure ulcer. The patient comes along with his daughter who is the caregiver and lives in an assisted living facility and has had a ulcerative area on the sacrum for the last 6 months. History of Present Illness (HPI) The following HPI elements were documented for the patient's wound: Location: pressure injury to the sacral region and the left low back area. Quality: Patient reports experiencing a dull pain to affected area(s). Severity: Patient states wound are getting worse. Duration: Patient has had the wound for > 3 months  prior to seeking treatment at the wound center Timing: Pain in wound is Intermittent (comes and goes Context: The wound appeared gradually over time Modifying Factors: Consults to this date include: wound VAC applications and local care Associated Signs and Symptoms: Wound has significant periowound erythema and localized edema 81 year old gentleman who had a stroke in February of this year and has been bedbound since then. Since March he has developed a pressure injury and ulceration and has been treated at assisted living with a wound VAC and other supportive care. His had a PEG tube in place and though he eats some food during the day he gets supplementary PEG tube feeds between 6 PM and 6 AM. Has not had diabetes mellitus and was pretty active before his illness. The patient was seen here in July and after that had a problem where he was readmitted to hospital at [facility] and at that time treated with a wound VAC and then discharged to a skilled nursing facility. His plastic surgery appointment was canceled and he has not yet seen them. the nursing home has been changing his wound VAC. His nutrition has improved and is getting adequate tube feeds supplements and vitamins. 05/06/2015 -- was file was reviewed at the [facility] Department  of reconstructive surgery and found to be unfit for surgery and reconstruction. His daughter confirms that they have not given him an appointment again. 05/26/2015 - he has not been here for about 3 weeks and today when he came here his wound VAC was not applied. 06/12/2015 -- the patient missed her last appointment as he was in the ER with a PEG tube which had come out and needed it to be replaced. He is doing fine otherwise. Hippler, Terrius ([id number]) Objective Constitutional Vitals Time Taken: 10:18 AM, Temperature: 97.9 F, Pulse: 98 bpm, Respiratory Rate: 16 breaths/min, Blood Pressure: 105/69 mmHg. Integumentary (Hair, Skin) Wound #2 status is Open. Original cause of wound was Pressure Injury. The wound is located on the Medial Sacrum. The wound measures 5.1cm length x 4.9cm width x 0.3cm depth; 19.627cm^2 area and 5.888cm^3 volume. The wound is limited to skin breakdown. There is no tunneling or undermining noted. There is a large amount of serosanguineous drainage noted. The wound margin is distinct with the outline attached to the wound base. There is large (67-100%) red granulation within the wound bed. There is no necrotic tissue within the wound bed. The periwound skin appearance exhibited: Moist. The periwound skin appearance did not exhibit: Callus, Crepitus, Excoriation, Fluctuance, Friable, Induration, Localized Edema, Rash, Scarring, Dry/Scaly, Maceration, Atrophie Blanche, Cyanosis, Ecchymosis, Hemosiderin Staining, Mottled, Pallor, Rubor, Erythema. Periwound temperature was noted as No Abnormality. Wound #3 status is Open. Original cause of wound was Pressure Injury. The wound is located on the Midline,Superior Sacrum. The wound measures 0.8cm length x 0.2cm width x 0.1cm depth; 0.126cm^2 area and 0.013cm^3 volume. The wound is limited to skin breakdown. There is no tunneling or undermining noted. There is a small amount of serous drainage noted. The wound margin is  epibole. There is large (67- 100%) pink granulation within the wound bed. There is no necrotic tissue within the wound bed. The periwound skin appearance exhibited: Moist. The periwound skin appearance did not exhibit: Callus, Crepitus, Excoriation, Fluctuance, Friable, Induration, Localized Edema, Rash, Scarring, Dry/Scaly, Maceration, Atrophie Blanche, Cyanosis, Ecchymosis, Hemosiderin Staining, Mottled, Pallor, Rubor, Erythema. Periwound temperature was noted as No Abnormality. The periwound has tenderness on palpation. Assessment Active Problems ICD-10 L89.153 - Pressure ulcer of sacral  region, stage 3 G81.90 - Hemiplegia, unspecified affecting unspecified side E44.1 - Mild protein-calorie malnutrition Bowker, Dashiell ([id number]) Plan Wound Cleansing: Wound #2 Medial Sacrum: Clean wound with Normal Saline. Wound #3 Midline,Superior Sacrum: Clean wound with Normal Saline. Skin Barriers/Peri-Wound Care: Wound #2 Medial Sacrum: Skin Prep Wound #3 Midline,Superior Sacrum: Skin Prep Primary Wound Dressing: Wound #2 Medial Sacrum: Prisma Ag - or collagen with silver equivalent under black foam with wound vac changes Wound #3 Midline,Superior Sacrum: Prisma Ag - Cover with dry gauze and wound vac drape, change this dressing with wound vac changes at [facility] Secondary Dressing: Wound #2 Medial Sacrum: [facility] Wound #3 Midline,Superior Sacrum: [facility] Dressing Change Frequency: Wound #2 Medial Sacrum: Three times weekly Wound #3 Midline,Superior Sacrum: Three times weekly Follow-up Appointments: Wound #2 Medial Sacrum: Return Appointment in 2 weeks. Wound #3 Midline,Superior Sacrum: Return Appointment in 2 weeks. Negative Pressure Wound Therapy: Wound #2 Medial Sacrum: Wound VAC settings at 125/130 mmHg continuous pressure. Use BLACK/GREEN foam to wound cavity. Use WHITE foam to fill any tunnel/s and/or  undermining. Change VAC dressing 3 X WEEK. Change canister as indicated when full. Nurse may titrate settings and frequency of dressing changes as clinically indicated. - To be applied by Skilled Nursing facility Follow-Up Appointments: A follow-up appointment should be scheduled. A Patient Clinical Summary of Care was provided to os Veal, Damein ([id number]) Continue wound vac for Rx of stage 3 pressure ulcer sacrum Electronic Signature(s) Signed: 07/10/2015 11:15:38 AM By: Ardath SaxParker, Evolett Somarriba MD Entered By: Ardath SaxParker, Shawnn Bouillon on 07/10/2015 11:15:38 Klees, Doss ([id number]) -------------------------------------------------------------------------------- SuperBill Details Patient Name: Housey, Kendon Date of Service: 07/10/2015 Medical Record Number: [id number] Patient Account Number: [id number] Date of Birth/Sex: 07/21/1930 [age] y.o. Male) Treating RN: Curtis Sitesorthy, Joanna Primary Care Physician: Christena FlakeARD, JOHN Other Clinician: Referring Physician: Christena FlakeARD, JOHN Treating Physician/Extender: Elayne SnarePARKER, Aalaya Yadao Weeks in Treatment: 11 Diagnosis Coding ICD-10 Codes Code Description L89.153 Pressure ulcer of sacral region, stage 3 G81.90 Hemiplegia, unspecified affecting unspecified side E44.1 Mild protein-calorie malnutrition Facility Procedures CPT4 Code: [id number] Description: 99213 - WOUND CARE VISIT-LEV 3 EST PT Modifier: Quantity: 1 Physician Procedures CPT4 Code: [id number] Description: [id number] - WC PHYS LEVEL 2 - EST PT ICD-10 Description Diagnosis L89.153 Pressure ulcer of sacral region, stage 3 Modifier: Quantity: 1 Electronic Signature(s) Signed: 07/10/2015 11:15:59 AM By: Ardath SaxParker, Lucette Kratz MD Entered By: Ardath SaxParker, Geanna Divirgilio on 07/10/2015 11:15:59

## 2015-07-11 NOTE — Progress Notes (Signed)
Rabel, Kayson ([id number]) Visit Report for 07/10/2015 Arrival Information Details Patient Name: Copado, Ayush Date of Service: 07/10/2015 10:00 AM Medical Record Number: [id number] Patient Account Number: [id number] Date of Birth/Sex: 10-27-1930 (81 y.o. Male) Treating RN: Curtis Sites Primary Care Physician: Christena Flake Other Clinician: Referring Physician: Christena Flake Treating Physician/Extender: Elayne Snare in Treatment: 11 Visit Information History Since Last Visit Added or deleted any medications: No Patient Arrived: Stretcher Any new allergies or adverse reactions: No Arrival Time: 10:14 Had a fall or experienced change in No Accompanied By: EMS and activities of daily living that may affect dtr risk of falls: Transfer Assistance: Stretcher Signs or symptoms of abuse/neglect since last No Patient Identification Verified: Yes visito Secondary Verification Process Yes Hospitalized since last visit: No Completed: Pain Present Now: No Patient Requires Transmission-Based No Precautions: Patient Has Alerts: No Electronic Signature(s) Signed: 07/10/2015 4:57:31 PM By: Curtis Sites Entered By: Curtis Sites on 07/10/2015 10:34:15 Standing, Onix ([id number]) -------------------------------------------------------------------------------- Clinic Level of Care Assessment Details Patient Name: Yzaguirre, Duvall Date of Service: 07/10/2015 10:00 AM Medical Record Number: [id number] Patient Account Number: [id number] Date of Birth/Sex: 1931/04/20 (81 y.o. Male) Treating RN: Curtis Sites Primary Care Physician: Christena Flake Other Clinician: Referring Physician: Christena Flake Treating Physician/Extender: Elayne Snare in Treatment: 11 Clinic Level of Care Assessment Items TOOL 4 Quantity Score  - Use when only an EandM is performed on FOLLOW-UP visit 0 ASSESSMENTS - Nursing Assessment / Reassessment X - Reassessment of Co-morbidities (includes updates in patient  status) 1 10 X - Reassessment of Adherence to Treatment Plan 1 5 ASSESSMENTS - Wound and Skin Assessment / Reassessment  - Simple Wound Assessment / Reassessment - one wound 0 X - Complex Wound Assessment / Reassessment - multiple wounds 2 5  - Dermatologic / Skin Assessment (not related to wound area) 0 ASSESSMENTS - Focused Assessment  - Circumferential Edema Measurements - multi extremities 0  - Nutritional Assessment / Counseling / Intervention 0  - Lower Extremity Assessment (monofilament, tuning fork, pulses) 0  - Peripheral Arterial Disease Assessment (using hand held doppler) 0 ASSESSMENTS - Ostomy and/or Continence Assessment and Care  - Incontinence Assessment and Management 0  - Ostomy Care Assessment and Management (repouching, etc.) 0 PROCESS - Coordination of Care X - Simple Patient / Family Education for ongoing care 1 15  - Complex (extensive) Patient / Family Education for ongoing care 0  - Staff obtains Chiropractor, Records, Test Results / Process Orders 0  - Staff telephones HHA, Nursing Homes / Clarify orders / etc 0  - Routine Transfer to another Facility (non-emergent condition) 0 [place], [place] ([id number])  - Routine Hospital Admission (non-emergent condition) 0  - New Admissions / Manufacturing engineer / Ordering NPWT, Apligraf, etc. 0  - Emergency Hospital Admission (emergent condition) 0 X - Simple Discharge Coordination 1 10  - Complex (extensive) Discharge Coordination 0 PROCESS - Special Needs  - Pediatric / Minor Patient Management 0  - Isolation Patient Management 0  - Hearing / Language / Visual special needs 0  - Assessment of Community assistance (transportation, D/C planning, etc.) 0  - Additional assistance / Altered mentation 0  - Support Surface(s) Assessment (bed, cushion, seat, etc.) 0 INTERVENTIONS - Wound Cleansing / Measurement  - Simple Wound Cleansing - one wound 0 X - Complex Wound Cleansing  - multiple wounds 2 5 X - Wound Imaging (photographs - any number of wounds) 1 5  - Wound Tracing (instead of photographs) 0  - Simple Wound Measurement -  one wound 0 X - Complex Wound Measurement - multiple wounds 2 5 INTERVENTIONS - Wound Dressings X - Small Wound Dressing one or multiple wounds 2 10 []  - Medium Wound Dressing one or multiple wounds 0 []  - Large Wound Dressing one or multiple wounds 0 []  - Application of Medications - topical 0 []  - Application of Medications - injection 0 INTERVENTIONS - Miscellaneous []  - External ear exam 0 Fantini, Kishaun ([id number]) []  - Specimen Collection (cultures, biopsies, blood, body fluids, etc.) 0 []  - Specimen(s) / Culture(s) sent or taken to Lab for analysis 0 []  - Patient Transfer (multiple staff / Nurse, adultHoyer Lift / Similar devices) 0 []  - Simple Staple / Suture removal (25 or less) 0 []  - Complex Staple / Suture removal (26 or more) 0 []  - Hypo / Hyperglycemic Management (close monitor of Blood Glucose) 0 []  - Ankle / Brachial Index (ABI) - do not check if billed separately 0 X - Vital Signs 1 5 Has the patient been seen at the hospital within the last three years: Yes Total Score: 100 Level Of Care: New/Established - Level 3 Electronic Signature(s) Signed: 07/10/2015 11:01:39 AM By: Curtis Sitesorthy, Joanna Entered By: Curtis Sitesorthy, Joanna on 07/10/2015 11:01:38 Fickle, Arya ([id number]) -------------------------------------------------------------------------------- Encounter Discharge Information Details Patient Name: Stiger, Blaze Date of Service: 07/10/2015 10:00 AM Medical Record Number: [id number] Patient Account Number: [id number] Date of Birth/Sex: 03/18/1931 [age] y.o. Male) Treating RN: Curtis Sitesorthy, Joanna Primary Care Physician: Christena FlakeARD, JOHN Other Clinician: Referring Physician: Christena FlakeARD, JOHN Treating Physician/Extender: Elayne SnarePARKER, PETER Weeks in Treatment: 11 Encounter Discharge Information Items Discharge Pain Level: 0 Discharge Condition:  Stable Ambulatory Status: Stretcher Discharge Destination: Nursing Home Transportation: Ambulance Accompanied By: ems and dtr Schedule Follow-up Appointment: Yes Medication Reconciliation completed and provided to Patient/Care No Venissa Nappi: Provided on Clinical Summary of Care: 07/10/2015 Form Type Recipient Paper Patient os Electronic Signature(s) Signed: 07/10/2015 11:16:26 AM By: Ardath SaxParker, Peter MD Previous Signature: 07/10/2015 11:02:28 AM Version By: Curtis Sitesorthy, Joanna Previous Signature: 07/10/2015 10:52:58 AM Version By: Francie MassingKelly, Tia Entered By: Ardath SaxParker, Peter on 07/10/2015 11:16:26 Windmiller, Aland ([id number]) -------------------------------------------------------------------------------- Multi Wound Chart Details Patient Name: Dicioccio, Yacine Date of Service: 07/10/2015 10:00 AM Medical Record Number: [id number] Patient Account Number: [id number] Date of Birth/Sex: 04/04/1931 12(81 y.o. Male) Treating RN: Curtis Sitesorthy, Joanna Primary Care Physician: Christena FlakeARD, JOHN Other Clinician: Referring Physician: Christena FlakeARD, JOHN Treating Physician/Extender: Elayne SnarePARKER, PETER Weeks in Treatment: 11 Vital Signs Height(in): Pulse(bpm): 98 Weight(lbs): Blood Pressure 105/69 (mmHg): Body Mass Index(BMI): Temperature(F): 97.9 Respiratory Rate 16 (breaths/min): Photos: [2:No Photos] [3:No Photos] [N/A:N/A] Wound Location: [2:Sacrum - Medial] [3:Sacrum - Midline, Superior] [N/A:N/A] Wounding Event: [2:Pressure Injury] [3:Pressure Injury] [N/A:N/A] Primary Etiology: [2:Pressure Ulcer] [3:Pressure Ulcer] [N/A:N/A] Comorbid History: [2:Cataracts, Glaucoma, Anemia, Hypertension, Osteoarthritis] [3:Cataracts, Glaucoma, Anemia, Hypertension, Osteoarthritis] [N/A:N/A] Date Acquired: [2:12/09/2014] [3:12/16/2014] [N/A:N/A] Weeks of Treatment: [2:11] [3:4] [N/A:N/A] Wound Status: [2:Open] [3:Open] [N/A:N/A] Measurements L x W x D 5.1x4.9x0.3 [3:0.8x0.2x0.1] [N/A:N/A] (cm) Area (cm) : [2:19.627] [3:0.126]  [N/A:N/A] Volume (cm) : [2:5.888] [3:0.013] [N/A:N/A] % Reduction in Area: [2:62.90%] [3:55.50%] [N/A:N/A] % Reduction in Volume: 44.40% [3:88.50%] [N/A:N/A] 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] [3:Category/Stage III] [N/A:N/A] Exudate Amount: [2:Large] [3:Small] [N/A:N/A] Exudate Type: [2:Serosanguineous] [3:Serous] [N/A:N/A] Exudate Color: [2:red, brown] [3:amber] [N/A:N/A] Foul Odor After [2:Yes] [3:Yes] [N/A:N/A] Cleansing: Odor Anticipated Due to No [3:No] [N/A:N/A] Product Use: Wound Margin: [2:Distinct, outline attached] [3:Epibole] [N/A:N/A] Granulation Amount: [2:Large (67-100%)] [3:Large (67-100%)] [N/A:N/A] Granulation Quality: [2:Red] [3:Pink] [N/A:N/A] Necrotic Amount: [2:None Present (0%)] [3:None Present (0%)] [N/A:N/A] Exposed Structures: [N/A:N/A] Fascia: No Fascia: No Fat: No Fat: No Tendon:  No Tendon: No Muscle: No Muscle: No Joint: No Joint: No Bone: No Bone: No Limited to Skin Limited to Skin Breakdown Breakdown Epithelialization: None None N/A Periwound Skin Texture: Edema: No Edema: No N/A Excoriation: No Excoriation: No Induration: No Induration: No Callus: No Callus: No Crepitus: No Crepitus: No Fluctuance: No Fluctuance: No Friable: No Friable: No Rash: No Rash: No Scarring: No Scarring: No Periwound Skin Moist: Yes Moist: Yes N/A Moisture: Maceration: No Maceration: No Dry/Scaly: No Dry/Scaly: No Periwound Skin Color: Atrophie Blanche: No Atrophie Blanche: No N/A Cyanosis: No Cyanosis: No Ecchymosis: No Ecchymosis: No Erythema: No Erythema: No Hemosiderin Staining: No Hemosiderin Staining: No Mottled: No Mottled: No Pallor: No Pallor: No Rubor: No Rubor: No Temperature: No Abnormality No Abnormality N/A Tenderness on No Yes N/A Palpation: Wound Preparation: Ulcer Cleansing: Ulcer Cleansing: N/A Rinsed/Irrigated with Rinsed/Irrigated with Saline Saline Topical Anesthetic Topical Anesthetic Applied:  None Applied: None Treatment Notes Electronic Signature(s) Signed: 07/10/2015 4:57:31 PM By: Curtis Sites Entered By: Curtis Sites on 07/10/2015 10:34:45 Sinyard, Holdyn ([id number]) -------------------------------------------------------------------------------- Multi-Disciplinary Care Plan Details Patient Name: Panik, Lycan Date of Service: 07/10/2015 10:00 AM Medical Record Number: [id number] Patient Account Number: [id number] Date of Birth/Sex: October 20, 1930 (81 y.o. Male) Treating RN: Curtis Sites Primary Care Physician: Christena Flake Other Clinician: Referring Physician: Christena Flake Treating Physician/Extender: Elayne Snare in Treatment: 11 Active Inactive Orientation to the Wound Care Program Nursing Diagnoses: Knowledge deficit related to the wound healing center program Goals: Patient/caregiver will verbalize understanding of the Wound Healing Center Program Date Initiated: 05/26/2015 Goal Status: Active Interventions: Provide education on orientation to the wound center Notes: Wound/Skin Impairment Nursing Diagnoses: Impaired tissue integrity Goals: Ulcer/skin breakdown will heal within 14 weeks Date Initiated: 05/26/2015 Goal Status: Active Interventions: Provide education on ulcer and skin care Notes: Electronic Signature(s) Signed: 07/10/2015 4:57:31 PM By: Curtis Sites Entered By: Curtis Sites on 07/10/2015 10:34:34 Hunn, Desiree ([id number]) -------------------------------------------------------------------------------- Patient/Caregiver Education Details Patient Name: Sigl, Ascencion Date of Service: 07/10/2015 10:00 AM Medical Record Number: [id number] Patient Account Number: [id number] Date of Birth/Gender: Feb 23, 1931 (81 y.o. Male) Treating RN: Curtis Sites Primary Care Physician: Christena Flake Other Clinician: Referring Physician: Christena Flake Treating Physician/Extender: Elayne Snare in Treatment: 11 Education Assessment Education  Provided To: Caregiver Education Topics Provided Nutrition: Handouts: Nutrition Methods: Explain/Verbal Responses: State content correctly Electronic Signature(s) Signed: 07/10/2015 11:16:40 AM By: Ardath Sax MD Previous Signature: 07/10/2015 11:02:45 AM Version By: Curtis Sites Entered By: Ardath Sax on 07/10/2015 11:16:39 Lightle, Jacobie ([id number]) -------------------------------------------------------------------------------- Wound Assessment Details Patient Name: Haapala, Hasaan Date of Service: 07/10/2015 10:00 AM Medical Record Number: [id number] Patient Account Number: [id number] Date of Birth/Sex: 1931-01-31 (81 y.o. Male) Treating RN: Curtis Sites Primary Care Physician: Christena Flake Other Clinician: Referring Physician: Christena Flake Treating Physician/Extender: Rudene Re in Treatment: 11 Wound Status Wound Number: 2 Primary Pressure Ulcer Etiology: Wound Location: Sacrum - Medial Wound Open Wounding Event: Pressure Injury Status: Date Acquired: 12/09/2014 Comorbid Cataracts, Glaucoma, Anemia, Weeks Of Treatment: 11 History: Hypertension, Osteoarthritis Clustered Wound: No Photos Photo Uploaded By: Curtis Sites on 07/10/2015 10:52:09 Wound Measurements Length: (cm) 5.1 Width: (cm) 4.9 Depth: (cm) 0.3 Area: (cm) 19.627 Volume: (cm) 5.888 % Reduction in Area: 62.9% % Reduction in Volume: 44.4% Epithelialization: None Tunneling: No Undermining: No Wound Description Classification: Category/Stage IV Foul Odor Af Wound Margin: Distinct, outline attached Due to Produ Exudate Amount: Large Exudate Type: Serosanguineous Exudate Color: red, brown ter Cleansing: Yes ct Use: No Wound Bed Granulation Amount: Large (67-100%) Exposed Structure Granulation Quality:  Red Fascia Exposed: No Necrotic Amount: None Present (0%) Fat Layer Exposed: No Tendon Exposed: No Lennon, Zakarie ([id number]) Muscle Exposed: No Joint Exposed: No Bone Exposed:  No Limited to Skin Breakdown Periwound Skin Texture Texture Color No Abnormalities Noted: No No Abnormalities Noted: No Callus: No Atrophie Blanche: No Crepitus: No Cyanosis: No Excoriation: No Ecchymosis: No Fluctuance: No Erythema: No Friable: No Hemosiderin Staining: No Induration: No Mottled: No Localized Edema: No Pallor: No Rash: No Rubor: No Scarring: No Temperature / Pain Moisture Temperature: No Abnormality No Abnormalities Noted: No Dry / Scaly: No Maceration: No Moist: Yes Wound Preparation Ulcer Cleansing: Rinsed/Irrigated with Saline Topical Anesthetic Applied: None Treatment Notes Wound #2 (Medial Sacrum) 1. Cleansed with: Clean wound with Normal Saline 3. Peri-wound Care: Skin Prep 4. Dressing Applied: Prisma Ag 5. Secondary Dressing Applied Bordered Foam Dressing Electronic Signature(s) Signed: 07/10/2015 10:31:35 AM By: Curtis Sites Entered By: Curtis Sites on 07/10/2015 10:31:35 Gambone, Duell ([id number]) -------------------------------------------------------------------------------- Wound Assessment Details Patient Name: Selinger, Khoury Date of Service: 07/10/2015 10:00 AM Medical Record Number: [id number] Patient Account Number: [id number] Date of Birth/Sex: July 05, 1931 (81 y.o. Male) Treating RN: Curtis Sites Primary Care Physician: Christena Flake Other Clinician: Referring Physician: Christena Flake Treating Physician/Extender: Rudene Re in Treatment: 11 Wound Status Wound Number: 3 Primary Pressure Ulcer Etiology: Wound Location: Sacrum - Midline, Superior Wound Open Wounding Event: Pressure Injury Status: Date Acquired: 12/16/2014 Comorbid Cataracts, Glaucoma, Anemia, Weeks Of Treatment: 4 History: Hypertension, Osteoarthritis Clustered Wound: No Photos Photo Uploaded By: Curtis Sites on 07/10/2015 10:52:09 Wound Measurements Length: (cm) 0.8 Width: (cm) 0.2 Depth: (cm) 0.1 Area: (cm) 0.126 Volume: (cm) 0.013 %  Reduction in Area: 55.5% % Reduction in Volume: 88.5% Epithelialization: None Tunneling: No Undermining: No Wound Description Classification: Category/Stage III Wound Margin: Epibole Exudate Amount: Small Exudate Type: Serous Exudate Color: amber Foul Odor After Cleansing: Yes Due to Product Use: No Wound Bed Granulation Amount: Large (67-100%) Exposed Structure Granulation Quality: Pink Fascia Exposed: No Necrotic Amount: None Present (0%) Fat Layer Exposed: No Tendon Exposed: No Kapaun, Reakwon ([id number]) Muscle Exposed: No Joint Exposed: No Bone Exposed: No Limited to Skin Breakdown Periwound Skin Texture Texture Color No Abnormalities Noted: No No Abnormalities Noted: No Callus: No Atrophie Blanche: No Crepitus: No Cyanosis: No Excoriation: No Ecchymosis: No Fluctuance: No Erythema: No Friable: No Hemosiderin Staining: No Induration: No Mottled: No Localized Edema: No Pallor: No Rash: No Rubor: No Scarring: No Temperature / Pain Moisture Temperature: No Abnormality No Abnormalities Noted: No Tenderness on Palpation: Yes Dry / Scaly: No Maceration: No Moist: Yes Wound Preparation Ulcer Cleansing: Rinsed/Irrigated with Saline Topical Anesthetic Applied: None Treatment Notes Wound #3 (Midline, Superior Sacrum) 1. Cleansed with: Clean wound with Normal Saline 3. Peri-wound Care: Skin Prep 4. Dressing Applied: Prisma Ag 5. Secondary Dressing Applied Bordered Foam Dressing Electronic Signature(s) Signed: 07/10/2015 10:32:07 AM By: Curtis Sites Entered By: Curtis Sites on 07/10/2015 10:32:06 Popelka, Drae ([id number]) -------------------------------------------------------------------------------- Vitals Details Patient Name: Berrios, Chinonso Date of Service: 07/10/2015 10:00 AM Medical Record Number: [id number] Patient Account Number: [id number] Date of Birth/Sex: [birth year] (81 y.o. Male) Treating RN: Curtis Sites Primary Care Physician: Christena Flake Other Clinician: Referring Physician: Christena Flake Treating Physician/Extender: Elayne Snare in Treatment: 11 Vital Signs Time Taken: 10:18 Temperature (F): 97.9 Pulse (bpm): 98 Respiratory Rate (breaths/min): 16 Blood Pressure (mmHg): 105/69 Reference Range: 80 - 120 mg / dl Electronic Signature(s) Signed: 07/10/2015 4:57:31 PM By: Curtis Sites Entered By: Curtis Sites on 07/10/2015 10:34:24

## 2015-07-24 ENCOUNTER — Ambulatory Visit: Payer: Medicare Other | Admitting: Surgery

## 2015-07-31 ENCOUNTER — Encounter: Payer: Medicare Other | Attending: Surgery | Admitting: Surgery

## 2015-07-31 DIAGNOSIS — L89153 Pressure ulcer of sacral region, stage 3: Secondary | ICD-10-CM | POA: Insufficient documentation

## 2015-07-31 DIAGNOSIS — G8190 Hemiplegia, unspecified affecting unspecified side: Secondary | ICD-10-CM | POA: Diagnosis not present

## 2015-07-31 DIAGNOSIS — E441 Mild protein-calorie malnutrition: Secondary | ICD-10-CM | POA: Diagnosis not present

## 2015-08-01 NOTE — Progress Notes (Addendum)
Norgard, Torence ([id number]) Visit Report for 07/31/2015 Arrival Information Details Patient Name: [name] [name], [name] [name] Date of Service: 07/31/2015 10:45 AM Medical Record Number: [id number] Patient Account Number: [id number] Date of Birth/Sex: 10-30-1930 (82 y.o. Male) Treating RN: Curtis Sites Primary Care Physician: Christena Flake Other Clinician: Referring Physician: Christena Flake Treating Physician/Extender: Rudene Re in Treatment: 14 Visit Information History Since Last Visit Added or deleted any medications: No Patient Arrived: Stretcher Any new allergies or adverse reactions: No Arrival Time: 10:36 Had a fall or experienced change in No Accompanied By: dtr activities of daily living that may affect Transfer Assistance: Manual risk of falls: Patient Identification Verified: Yes Signs or symptoms of abuse/neglect since last No Secondary Verification Process Yes visito Completed: Hospitalized since last visit: No Patient Requires Transmission-Based No Pain Present Now: No Precautions: Patient Has Alerts: No Electronic Signature(s) Signed: 07/31/2015 5:48:27 PM By: Curtis Sites Entered By: Curtis Sites on 07/31/2015 10:39:14 Zertuche, Leonidas ([id number]) -------------------------------------------------------------------------------- Clinic Level of Care Assessment Details Patient Name: [name] [name] Date of Service: 07/31/2015 10:45 AM Medical Record Number: [id number] Patient Account Number: [id number] Date of Birth/Sex: August 11, 1930 (82 y.o. Male) Treating RN: Clover Mealy, RN, BSN, Rita Primary Care Physician: Christena Flake Other Clinician: Referring Physician: Christena Flake Treating Physician/Extender: Rudene Re in Treatment: 14 Clinic Level of Care Assessment Items TOOL 4 Quantity Score  - Use when only an EandM is performed on FOLLOW-UP visit 0 ASSESSMENTS - Nursing Assessment / Reassessment X - Reassessment of Co-morbidities (includes updates in patient status) 1  10 X - Reassessment of Adherence to Treatment Plan 1 5 ASSESSMENTS - Wound and Skin Assessment / Reassessment X - Simple Wound Assessment / Reassessment - one wound 1 5  - Complex Wound Assessment / Reassessment - multiple wounds 0  - Dermatologic / Skin Assessment (not related to wound area) 0 ASSESSMENTS - Focused Assessment  - Circumferential Edema Measurements - multi extremities 0  - Nutritional Assessment / Counseling / Intervention 0  - Lower Extremity Assessment (monofilament, tuning fork, pulses) 0  - Peripheral Arterial Disease Assessment (using hand held doppler) 0 ASSESSMENTS - Ostomy and/or Continence Assessment and Care  - Incontinence Assessment and Management 0  - Ostomy Care Assessment and Management (repouching, etc.) 0 PROCESS - Coordination of Care X - Simple Patient / Family Education for ongoing care 1 15  - Complex (extensive) Patient / Family Education for ongoing care 0  - Staff obtains Chiropractor, Records, Test Results / Process Orders 0 X - Staff telephones HHA, Nursing Homes / Clarify orders / etc 1 10  - Routine Transfer to another Facility (non-emergent condition) 0 [name] [name] ([id number])  - Routine Hospital Admission (non-emergent condition) 0  - New Admissions / Manufacturing engineer / Ordering NPWT, Apligraf, etc. 0  - Emergency Hospital Admission (emergent condition) 0  - Simple Discharge Coordination 0  - Complex (extensive) Discharge Coordination 0 PROCESS - Special Needs  - Pediatric / Minor Patient Management 0  - Isolation Patient Management 0  - Hearing / Language / Visual special needs 0  - Assessment of Community assistance (transportation, D/C planning, etc.) 0  - Additional assistance / Altered mentation 0  - Support Surface(s) Assessment (bed, cushion, seat, etc.) 0 INTERVENTIONS - Wound Cleansing / Measurement X - Simple Wound Cleansing - one wound 1 5  - Complex Wound Cleansing -  multiple wounds 0 X - Wound Imaging (photographs - any number of wounds) 1 5  - Wound Tracing (instead of photographs) 0 X - Simple Wound Measurement -  one wound 1 5 []  - Complex Wound Measurement - multiple wounds 0 INTERVENTIONS - Wound Dressings X - Small Wound Dressing one or multiple wounds 1 10 []  - Medium Wound Dressing one or multiple wounds 0 []  - Large Wound Dressing one or multiple wounds 0 []  - Application of Medications - topical 0 []  - Application of Medications - injection 0 INTERVENTIONS - Miscellaneous []  - External ear exam 0 [name] [name] ([id number]) []  - Specimen Collection (cultures, biopsies, blood, body fluids, etc.) 0 []  - Specimen(s) / Culture(s) sent or taken to Lab for analysis 0 []  - Patient Transfer (multiple staff / Michiel Sites Lift / Similar devices) 0 []  - Simple Staple / Suture removal (25 or less) 0 []  - Complex Staple / Suture removal (26 or more) 0 []  - Hypo / Hyperglycemic Management (close monitor of Blood Glucose) 0 []  - Ankle / Brachial Index (ABI) - do not check if billed separately 0 X - Vital Signs 1 5 Has the patient been seen at the hospital within the last three years: Yes Total Score: 75 Level Of Care: New/Established - Level 2 Electronic Signature(s) Signed: 07/31/2015 4:27:43 PM By: Elpidio Eric BSN, RN Entered By: Elpidio Eric on 07/31/2015 11:07:26 Erman, Nivan ([id number]) -------------------------------------------------------------------------------- Encounter Discharge Information Details Patient Name: [name] [name] Date of Service: 07/31/2015 10:45 AM Medical Record Number: [id number] Patient Account Number: [id number] Date of Birth/Sex: 1931-03-06 (82 y.o. Male) Treating RN: Curtis Sites Primary Care Physician: Christena Flake Other Clinician: Referring Physician: Christena Flake Treating Physician/Extender: Rudene Re in Treatment: 14 Encounter Discharge Information Items Discharge Pain Level: 0 Discharge Condition:  Stable Ambulatory Status: Stretcher Discharge Destination: Nursing Home Transportation: Ambulance Accompanied By: ems and dtr Schedule Follow-up Appointment: Yes Medication Reconciliation completed and provided to Patient/Care No Tenae Graziosi: Provided on Clinical Summary of Care: 07/31/2015 Form Type Recipient Paper Patient OS Electronic Signature(s) Signed: 07/31/2015 11:12:49 AM By: Curtis Sites Previous Signature: 07/31/2015 11:03:21 AM Version By: Gwenlyn Perking Entered By: Curtis Sites on 07/31/2015 11:12:48 Platner, Koleton ([id number]) -------------------------------------------------------------------------------- Multi Wound Chart Details Patient Name: Demps, Cloy Date of Service: 07/31/2015 10:45 AM Medical Record Number: [id number] Patient Account Number: [id number] Date of Birth/Sex: December 15, 1930 (82 y.o. Male) Treating RN: Clover Mealy, RN, BSN, [place] Primary Care Physician: Christena Flake Other Clinician: Referring Physician: Christena Flake Treating Physician/Extender: Rudene Re in Treatment: 14 Vital Signs Height(in): Pulse(bpm): 100 Weight(lbs): Blood Pressure 100/60 (mmHg): Body Mass Index(BMI): Temperature(F): 98.8 Respiratory Rate 14 (breaths/min): Photos: [2:No Photos] [3:No Photos] [N/A:N/A] Wound Location: [2:Sacrum - Medial] [3:Midline, Superior Sacrum] [N/A:N/A] Wounding Event: [2:Pressure Injury] [3:Pressure Injury] [N/A:N/A] Primary Etiology: [2:Pressure Ulcer] [3:Pressure Ulcer] [N/A:N/A] Comorbid History: [2:Cataracts, Glaucoma, Anemia, Hypertension, Osteoarthritis] [3:N/A] [N/A:N/A] Date Acquired: [2:12/09/2014] [3:12/16/2014] [N/A:N/A] Weeks of Treatment: [2:14] [3:7] [N/A:N/A] Wound Status: [2:Open] [3:Healed - Epithelialized] [N/A:N/A] Measurements L x W x D 5.3x4.2x0.2 [3:0x0x0] [N/A:N/A] (cm) Area (cm) : [2:17.483] [3:0] [N/A:N/A] Volume (cm) : [2:3.497] [3:0] [N/A:N/A] % Reduction in Area: [2:67.00%] [3:100.00%] [N/A:N/A] % Reduction in  Volume: 67.00% [3:100.00%] [N/A:N/A] 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] [3:Category/Stage III] [N/A:N/A] Exudate Amount: [2:Large] [3:N/A] [N/A:N/A] Exudate Type: [2:Serosanguineous] [3:N/A] [N/A:N/A] Exudate Color: [2:red, brown] [3:N/A] [N/A:N/A] Foul Odor After [2:Yes] [3:N/A] [N/A:N/A] Cleansing: Odor Anticipated Due to No [3:N/A] [N/A:N/A] Product Use: Wound Margin: [2:Distinct, outline attached] [3:N/A] [N/A:N/A] Granulation Amount: [2:Large (67-100%)] [3:N/A] [N/A:N/A] Granulation Quality: [2:Red] [3:N/A] [N/A:N/A] Necrotic Amount: [2:Small (1-33%)] [3:N/A] [N/A:N/A] Exposed Structures: [2:Fascia: No Fat: No] [3:N/A] [N/A:N/A] Tendon: No Muscle: No Joint: No Bone: No Limited to Skin Breakdown Epithelialization: None N/A N/A Periwound  Skin Texture: Scarring: Yes No Abnormalities Noted N/A Edema: No Excoriation: No Induration: No Callus: No Crepitus: No Fluctuance: No Friable: No Rash: No Periwound Skin Maceration: Yes No Abnormalities Noted N/A Moisture: Moist: Yes Dry/Scaly: No Periwound Skin Color: Atrophie Blanche: No No Abnormalities Noted N/A Cyanosis: No Ecchymosis: No Erythema: No Hemosiderin Staining: No Mottled: No Pallor: No Rubor: No Temperature: No Abnormality N/A N/A Tenderness on No No N/A Palpation: Wound Preparation: Ulcer Cleansing: N/A N/A Rinsed/Irrigated with Saline Topical Anesthetic Applied: None Treatment Notes Electronic Signature(s) Signed: 07/31/2015 4:27:43 PM By: Elpidio Eric BSN, RN Entered By: Elpidio Eric on 07/31/2015 10:52:05 Vitali, Darnelle ([id number]) -------------------------------------------------------------------------------- Multi-Disciplinary Care Plan Details Patient Name: Boccio, Aneudy Date of Service: 07/31/2015 10:45 AM Medical Record Number: [id number] Patient Account Number: [id number] Date of Birth/Sex: Jan 27, 1931 (82 y.o. Male) Treating RN: Clover Mealy, RN, BSN, Rita Primary Care Physician: Christena Flake Other  Clinician: Referring Physician: Christena Flake Treating Physician/Extender: Rudene Re in Treatment: 14 Active Inactive Electronic Signature(s) Signed: 09/16/2015 1:28:55 PM By: Curtis Sites Signed: 03/09/2016 3:29:10 PM By: Elpidio Eric BSN, RN Previous Signature: 07/31/2015 4:27:43 PM Version By: Elpidio Eric BSN, RN Entered By: Curtis Sites on 09/16/2015 13:28:55 Talamo, Meagan ([id number]) -------------------------------------------------------------------------------- Patient/Caregiver Education Details Patient Name: Blas, Jonatha Date of Service: 07/31/2015 10:45 AM Medical Record Number: [id number] Patient Account Number: [id number] Date of Birth/Gender: 01-04-1931 (82 y.o. Male) Treating RN: Curtis Sites Primary Care Physician: Christena Flake Other Clinician: Referring Physician: Christena Flake Treating Physician/Extender: Rudene Re in Treatment: 14 Education Assessment Education Provided To: Caregiver Education Topics Provided Wound/Skin Impairment: Handouts: Other: need for NPWT foam to be cut smaller than wound to help edges close Methods: Explain/Verbal Responses: State content correctly Electronic Signature(s) Signed: 07/31/2015 11:13:20 AM By: Curtis Sites Entered By: Curtis Sites on 07/31/2015 11:13:20 Moseley, Gaudencio ([id number]) -------------------------------------------------------------------------------- Wound Assessment Details Patient Name: Orlick, Efraim Date of Service: 07/31/2015 10:45 AM Medical Record Number: [id number] Patient Account Number: [id number] Date of Birth/Sex: [birth year] (82 y.o. Male) Treating RN: Curtis Sites Primary Care Physician: Christena Flake Other Clinician: Referring Physician: Christena Flake Treating Physician/Extender: Rudene Re in Treatment: 14 Wound Status Wound Number: 2 Primary Pressure Ulcer Etiology: Wound Location: Sacrum - Medial Wound Open Wounding Event: Pressure Injury Status: Date Acquired:  12/09/2014 Comorbid Cataracts, Glaucoma, Anemia, Weeks Of Treatment: 14 History: Hypertension, Osteoarthritis Clustered Wound: No Photos Photo Uploaded By: Curtis Sites on 07/31/2015 12:44:07 Wound Measurements Length: (cm) 5.3 Width: (cm) 4.2 Depth: (cm) 0.2 Area: (cm) 17.483 Volume: (cm) 3.497 % Reduction in Area: 67% % Reduction in Volume: 67% Epithelialization: None Tunneling: No Undermining: No Wound Description Classification: Category/Stage IV Foul Odor Af Wound Margin: Distinct, outline attached Due to Produ Exudate Amount: Large Exudate Type: Serosanguineous Exudate Color: red, brown ter Cleansing: Yes ct Use: No Wound Bed Granulation Amount: Large (67-100%) Exposed Structure Granulation Quality: Red Fascia Exposed: No Necrotic Amount: Small (1-33%) Fat Layer Exposed: No Necrotic Quality: Adherent Slough Tendon Exposed: No Bielefeld, Shakur ([id number]) Muscle Exposed: No Joint Exposed: No Bone Exposed: No Limited to Skin Breakdown Periwound Skin Texture Texture Color No Abnormalities Noted: No No Abnormalities Noted: No Callus: No Atrophie Blanche: No Crepitus: No Cyanosis: No Excoriation: No Ecchymosis: No Fluctuance: No Erythema: No Friable: No Hemosiderin Staining: No Induration: No Mottled: No Localized Edema: No Pallor: No Rash: No Rubor: No Scarring: Yes Temperature / Pain Moisture Temperature: No Abnormality No Abnormalities Noted: No Dry / Scaly: No Maceration: Yes Moist: Yes Wound Preparation Ulcer Cleansing: Rinsed/Irrigated with Saline Topical Anesthetic Applied: None  Electronic Signature(s) Signed: 07/31/2015 5:48:27 PM By: Curtis Sites Entered By: Curtis Sites on 07/31/2015 10:47:45 Belmares, Ferron ([id number]) -------------------------------------------------------------------------------- Wound Assessment Details Patient Name: Barcellos, Michal Date of Service: 07/31/2015 10:45 AM Medical Record Number: [id number] Patient  Account Number: [id number] Date of Birth/Sex: [birth year] (82 y.o. Male) Treating RN: Curtis Sites Primary Care Physician: Christena Flake Other Clinician: Referring Physician: Christena Flake Treating Physician/Extender: Rudene Re in Treatment: 14 Wound Status Wound Number: 3 Primary Etiology: Pressure Ulcer Wound Location: Midline, Superior Sacrum Wound Status: Healed - Epithelialized Wounding Event: Pressure Injury Date Acquired: 12/16/2014 Weeks Of Treatment: 7 Clustered Wound: No Photos Photo Uploaded By: Curtis Sites on 07/31/2015 12:44:07 Wound Measurements Length: (cm) 0 % Reduction i Width: (cm) 0 % Reduction i Depth: (cm) 0 Area: (cm) 0 Volume: (cm) 0 n Area: 100% n Volume: 100% Wound Description Classification: Category/Stage III Periwound Skin Texture Texture Color No Abnormalities Noted: No No Abnormalities Noted: No Moisture No Abnormalities Noted: No Electronic Signature(s) Signed: 07/31/2015 5:48:27 PM By: Curtis Sites Rabelo, Tage ([id number]) Entered By: Curtis Sites on 07/31/2015 10:46:28 Arkwright, Delma ([id number]) -------------------------------------------------------------------------------- Vitals Details Patient Name: Foushee, Jashan Date of Service: 07/31/2015 10:45 AM Medical Record Number: [id number] Patient Account Number: [id number] Date of Birth/Sex: 07-Aug-1930 (82 y.o. Male) Treating RN: Curtis Sites Primary Care Physician: Christena Flake Other Clinician: Referring Physician: Christena Flake Treating Physician/Extender: Rudene Re in Treatment: 14 Vital Signs Time Taken: 10:39 Temperature (F): 98.8 Pulse (bpm): 100 Respiratory Rate (breaths/min): 14 Blood Pressure (mmHg): 100/60 Reference Range: 80 - 120 mg / dl Electronic Signature(s) Signed: 07/31/2015 5:48:27 PM By: Curtis Sites Entered By: Curtis Sites on 07/31/2015 10:42:12

## 2015-08-01 NOTE — Progress Notes (Signed)
[name], [name] ([id number]) Visit Report for 07/31/2015 Chief Complaint Document Details Patient Name: [name] [name], [name] [name] Date of Service: 07/31/2015 10:45 AM Medical Record Number: [id number] Patient Account Number: [id number] Date of Birth/Sex: 1930/11/22 (82 y.o. Male) Treating RN: Curtis Sites Primary Care Physician: Christena Flake Other Clinician: Referring Physician: Christena Flake Treating Physician/Extender: Rudene Re in Treatment: 14 Information Obtained from: Caregiver Chief Complaint Patient is at the clinic for treatment of an open pressure ulcer. The patient comes along with his daughter who is the caregiver and lives in an assisted living facility and has had a ulcerative area on the sacrum for the last 6 months. Electronic Signature(s) Signed: 07/31/2015 10:57:55 AM By: Evlyn Kanner MD, FACS Entered By: Evlyn Kanner on 07/31/2015 10:57:54 Karel, [name] ([id number]) -------------------------------------------------------------------------------- HPI Details Patient Name: [name] [name], [name] [name] Date of Service: 07/31/2015 10:45 AM Medical Record Number: [id number] Patient Account Number: [id number] Date of Birth/Sex: [birth year] (82 y.o. Male) Treating RN: Curtis Sites Primary Care Physician: Christena Flake Other Clinician: Referring Physician: Christena Flake Treating Physician/Extender: Rudene Re in Treatment: 14 History of Present Illness Location: pressure injury to the sacral region and the left low back area. Quality: Patient reports experiencing a dull pain to affected area(s). Severity: Patient states wound are getting worse. Duration: Patient has had the wound for > 3 months prior to seeking treatment at the wound center Timing: Pain in wound is Intermittent (comes and goes Context: The wound appeared gradually over time Modifying Factors: Consults to this date include: wound VAC applications and local care Associated Signs and Symptoms: Wound has significant periowound  erythema and localized edema HPI Description: 82 year old gentleman who had a stroke in February of this year and has been bedbound since then. Since March he has developed a pressure injury and ulceration and has been treated at assisted living with a wound VAC and other supportive care. His had a PEG tube in place and though he eats some food during the day he gets supplementary PEG tube feeds between 6 PM and 6 AM. Has not had diabetes mellitus and was pretty active before his illness. The patient was seen here in July and after that had a problem where he was readmitted to hospital at [facility] and at that time treated with a wound VAC and then discharged to a skilled nursing facility. His plastic surgery appointment was canceled and he has not yet seen them. the nursing home has been changing his wound VAC. His nutrition has improved and is getting adequate tube feeds supplements and vitamins. 05/06/2015 -- was file was reviewed at the [facility] Department of reconstructive surgery and found to be unfit for surgery and reconstruction. His daughter confirms that they have not given him an appointment again. 05/26/2015 - he has not been here for about 3 weeks and today when he came here his wound VAC was not applied. 06/12/2015 -- the patient missed her last appointment as he was in the ER with a PEG tube which had come out and needed it to be replaced. He is doing fine otherwise. 07/31/2015 -- his daughter says overall he has been doing very well and at the present time they have been using the wound VAC regularly. Electronic Signature(s) Signed: 07/31/2015 10:58:23 AM By: Evlyn Kanner MD, FACS Entered By: Evlyn Kanner on 07/31/2015 10:58:22 [name], Mitchael ([id number]) -------------------------------------------------------------------------------- Physical Exam Details Patient Name: [name] [name], [name] [name] Date of Service: 07/31/2015 10:45 AM Medical Record Number: [id number] Patient Account  Number: [id number] Date of Birth/Sex: 29-Dec-1930 (82 y.o. Male)  Treating RN: Curtis Sites Primary Care Physician: Christena Flake Other Clinician: Referring Physician: Christena Flake Treating Physician/Extender: Rudene Re in Treatment: 14 Constitutional . Pulse regular. Respirations normal and unlabored. Afebrile. . Eyes Nonicteric. Reactive to light. Ears, Nose, Mouth, and Throat Lips, teeth, and gums WNL.Marland Kitchen Moist mucosa without lesions. Neck supple and nontender. No palpable supraclavicular or cervical adenopathy. Normal sized without goiter. Respiratory WNL. No retractions.. Cardiovascular Pedal Pulses WNL. No clubbing, cyanosis or edema. Lymphatic No adneopathy. No adenopathy. No adenopathy. Musculoskeletal Adexa without tenderness or enlargement.. Digits and nails w/o clubbing, cyanosis, infection, petechiae, ischemia, or inflammatory conditions.. Integumentary (Hair, Skin) No suspicious lesions. No crepitus or fluctuance. No peri-wound warmth or erythema. No masses.Marland Kitchen Psychiatric Judgement and insight Intact.. No evidence of depression, anxiety, or agitation.. Notes he has healthy granulation tissue and no evidence of surrounding excoriation and overall is excellent progression in healing Electronic Signature(s) Signed: 07/31/2015 10:59:01 AM By: Evlyn Kanner MD, FACS Entered By: Evlyn Kanner on 07/31/2015 10:59:00 Retana, Juda ([id number]) -------------------------------------------------------------------------------- Physician Orders Details Patient Name: [name], [name] Date of Service: 07/31/2015 10:45 AM Medical Record Number: [id number] Patient Account Number: [id number] Date of Birth/Sex: Mar 16, 1931 (82 y.o. Male) Treating RN: Clover Mealy, RN, BSN, [place] Sink Primary Care Physician: Christena Flake Other Clinician: Referring Physician: Christena Flake Treating Physician/Extender: Rudene Re in Treatment: 27 Verbal / Phone Orders: Yes Clinician: Afful, RN, BSN, Rita Read  Back and Verified: Yes Diagnosis Coding Wound Cleansing Wound #2 Medial Sacrum o Clean wound with Normal Saline. Skin Barriers/Peri-Wound Care Wound #2 Medial Sacrum o Skin Prep Primary Wound Dressing Wound #2 Medial Sacrum o Prisma Ag - or collagen with silver equivalent under black foam with wound vac changes. Secondary Dressing Wound #2 Medial Sacrum o Boardered Foam Dressing - At [facility] Dressing Change Frequency Wound #2 Medial Sacrum o Three times weekly Follow-up Appointments Wound #2 Medial Sacrum o Return Appointment in 2 weeks. Negative Pressure Wound Therapy Wound #2 Medial Sacrum o Wound VAC settings at 125/130 mmHg continuous pressure. Use BLACK/GREEN foam to wound cavity. Use WHITE foam to fill any tunnel/s and/or undermining. Change VAC dressing 3 X WEEK. Change canister as indicated when full. Nurse may titrate settings and frequency of dressing changes as clinically indicated. - To be applied by Skilled Nursing facility. Please cut black foam approximate by the wound size. DO not cut black foam big to exceeds periwound. Very imperative! o Number of foam/gauze pieces used in the dressing = Radcliffe, Arcenio ([id number]) Electronic Signature(s) Signed: 07/31/2015 4:22:03 PM By: Evlyn Kanner MD, FACS Signed: 07/31/2015 4:27:43 PM By: Elpidio Eric BSN, RN Entered By: Elpidio Eric on 07/31/2015 10:56:12 Wilbon, Kosei ([id number]) -------------------------------------------------------------------------------- Problem List Details Patient Name: Hakanson, Shay Date of Service: 07/31/2015 10:45 AM Medical Record Number: [id number] Patient Account Number: [id number] Date of Birth/Sex: Jan 04, 1931 (82 y.o. Male) Treating RN: Clover Mealy, RN, BSN, [place] Sink Primary Care Physician: Christena Flake Other Clinician: Referring Physician: Christena Flake Treating Physician/Extender: Rudene Re in Treatment: 14 Active Problems ICD-10 Encounter Code Description Active  Date Diagnosis L89.153 Pressure ulcer of sacral region, stage 3 04/22/2015 Yes G81.90 Hemiplegia, unspecified affecting unspecified side 04/22/2015 Yes E44.1 Mild protein-calorie malnutrition 04/22/2015 Yes Inactive Problems Resolved Problems Electronic Signature(s) Signed: 07/31/2015 10:57:48 AM By: Evlyn Kanner MD, FACS Entered By: Evlyn Kanner on 07/31/2015 10:57:47 Liwanag, Stormy ([id number]) -------------------------------------------------------------------------------- Progress Note Details Patient Name: [name] [name], [name] [name] Date of Service: 07/31/2015 10:45 AM Medical Record Number: [id number] Patient Account Number: [id number] Date of Birth/Sex: [birth year] (82 y.o. Male) Treating RN: Curtis Sites Primary  Care Physician: Christena Flake Other Clinician: Referring Physician: Christena Flake Treating Physician/Extender: Rudene Re in Treatment: 14 Subjective Chief Complaint Information obtained from Caregiver Patient is at the clinic for treatment of an open pressure ulcer. The patient comes along with his daughter who is the caregiver and lives in an assisted living facility and has had a ulcerative area on the sacrum for the last 6 months. History of Present Illness (HPI) The following HPI elements were documented for the patient's wound: Location: pressure injury to the sacral region and the left low back area. Quality: Patient reports experiencing a dull pain to affected area(s). Severity: Patient states wound are getting worse. Duration: Patient has had the wound for > 3 months prior to seeking treatment at the wound center Timing: Pain in wound is Intermittent (comes and goes Context: The wound appeared gradually over time Modifying Factors: Consults to this date include: wound VAC applications and local care Associated Signs and Symptoms: Wound has significant periowound erythema and localized edema 82 year old gentleman who had a stroke in February of this year and has been  bedbound since then. Since March he has developed a pressure injury and ulceration and has been treated at assisted living with a wound VAC and other supportive care. His had a PEG tube in place and though he eats some food during the day he gets supplementary PEG tube feeds between 6 PM and 6 AM. Has not had diabetes mellitus and was pretty active before his illness. The patient was seen here in July and after that had a problem where he was readmitted to hospital at [facility] and at that time treated with a wound VAC and then discharged to a skilled nursing facility. His plastic surgery appointment was canceled and he has not yet seen them. the nursing home has been changing his wound VAC. His nutrition has improved and is getting adequate tube feeds supplements and vitamins. 05/06/2015 -- was file was reviewed at the [facility] Department of reconstructive surgery and found to be unfit for surgery and reconstruction. His daughter confirms that they have not given him an appointment again. 05/26/2015 - he has not been here for about 3 weeks and today when he came here his wound VAC was not applied. 06/12/2015 -- the patient missed her last appointment as he was in the ER with a PEG tube which had come out and needed it to be replaced. He is doing fine otherwise. 07/31/2015 -- his daughter says overall he has been doing very well and at the present time they have been using the wound VAC regularly. [name] [name], [name] [name] ([id number]) Objective Constitutional Pulse regular. Respirations normal and unlabored. Afebrile. Vitals Time Taken: 10:39 AM, Temperature: 98.8 F, Pulse: 100 bpm, Respiratory Rate: 14 breaths/min, Blood Pressure: 100/60 mmHg. Eyes Nonicteric. Reactive to light. Ears, Nose, Mouth, and Throat Lips, teeth, and gums WNL.Marland Kitchen Moist mucosa without lesions. Neck supple and nontender. No palpable supraclavicular or cervical adenopathy. Normal sized without goiter. Respiratory WNL. No  retractions.. Cardiovascular Pedal Pulses WNL. No clubbing, cyanosis or edema. Lymphatic No adneopathy. No adenopathy. No adenopathy. Musculoskeletal Adexa without tenderness or enlargement.. Digits and nails w/o clubbing, cyanosis, infection, petechiae, ischemia, or inflammatory conditions.Marland Kitchen Psychiatric Judgement and insight Intact.. No evidence of depression, anxiety, or agitation.. General Notes: he has healthy granulation tissue and no evidence of surrounding excoriation and overall is excellent progression in healing Integumentary (Hair, Skin) No suspicious lesions. No crepitus or fluctuance. No peri-wound warmth or erythema. No masses.. Wound #2  status is Open. Original cause of wound was Pressure Injury. The wound is located on the Medial Sacrum. The wound measures 5.3cm length x 4.2cm width x 0.2cm depth; 17.483cm^2 area and 3.497cm^3 volume. The wound is limited to skin breakdown. There is no tunneling or undermining noted. [name] [name], [name] [name] ([id number]) There is a large amount of serosanguineous drainage noted. The wound margin is distinct with the outline attached to the wound base. There is large (67-100%) red granulation within the wound bed. There is a small (1-33%) amount of necrotic tissue within the wound bed including Adherent Slough. The periwound skin appearance exhibited: Scarring, Maceration, Moist. The periwound skin appearance did not exhibit: Callus, Crepitus, Excoriation, Fluctuance, Friable, Induration, Localized Edema, Rash, Dry/Scaly, Atrophie Blanche, Cyanosis, Ecchymosis, Hemosiderin Staining, Mottled, Pallor, Rubor, Erythema. Periwound temperature was noted as No Abnormality. Wound #3 status is Healed - Epithelialized. Original cause of wound was Pressure Injury. The wound is located on the Midline,Superior Sacrum. The wound measures 0cm length x 0cm width x 0cm depth; 0cm^2 area and 0cm^3 volume. Assessment Active Problems ICD-10 L89.153 - Pressure ulcer of  sacral region, stage 3 G81.90 - Hemiplegia, unspecified affecting unspecified side E44.1 - Mild protein-calorie malnutrition At the present time we will use silver collagen under the black foam and use the wound VAC for another week or 2 before discontinuing it. The patient's nutrition continues to improve and he is getting vitamin supplements. He will come back and see as next week Plan Wound Cleansing: Wound #2 Medial Sacrum: Clean wound with Normal Saline. Skin Barriers/Peri-Wound Care: Wound #2 Medial Sacrum: Skin Prep Primary Wound Dressing: Wound #2 Medial Sacrum: Prisma Ag - or collagen with silver equivalent under black foam with wound vac changes. Secondary Dressing: Wound #2 Medial Sacrum: Tison, [name] [name] ([id number]) Boardered Foam Dressing - At [facility] Dressing Change Frequency: Wound #2 Medial Sacrum: Three times weekly Follow-up Appointments: Wound #2 Medial Sacrum: Return Appointment in 2 weeks. Negative Pressure Wound Therapy: Wound #2 Medial Sacrum: Wound VAC settings at 125/130 mmHg continuous pressure. Use BLACK/GREEN foam to wound cavity. Use WHITE foam to fill any tunnel/s and/or undermining. Change VAC dressing 3 X WEEK. Change canister as indicated when full. Nurse may titrate settings and frequency of dressing changes as clinically indicated. - To be applied by Skilled Nursing facility. Please cut black foam approximate by the wound size. DO not cut black foam big to exceeds periwound. Very imperative! Number of foam/gauze pieces used in the dressing = At the present time we will use silver collagen under the black foam and use the wound VAC for another week or 2 before discontinuing it. The patient's nutrition continues to improve and he is getting vitamin supplements. He will come back and see as next week Electronic Signature(s) Signed: 07/31/2015 11:00:07 AM By: Evlyn Kanner MD, FACS Entered By: Evlyn Kanner on 07/31/2015 11:00:06 Faux, [name] [name]  ([id number]) -------------------------------------------------------------------------------- SuperBill Details Patient Name: Kory, Travarus Date of Service: 07/31/2015 Medical Record Number: [id number] Patient Account Number: [id number] Date of Birth/Sex: 08-13-1930 (82 y.o. Male) Treating RN: Curtis Sites Primary Care Physician: Christena Flake Other Clinician: Referring Physician: Christena Flake Treating Physician/Extender: Rudene Re in Treatment: 14 Diagnosis Coding ICD-10 Codes Code Description [phone] Pressure ulcer of sacral region, stage 3 G81.90 Hemiplegia, unspecified affecting unspecified side E44.1 Mild protein-calorie malnutrition Facility Procedures CPT4 Code: [id number] Description: [phone] - WOUND CARE VISIT-LEV 2 EST PT Modifier: Quantity: 1 Physician Procedures CPT4 Code: [id number] Description: 99213 - WC PHYS LEVEL 3 - EST PT ICD-10 Description Diagnosis  L89.153 Pressure ulcer of sacral region, stage 3 G81.90 Hemiplegia, unspecified affecting unspecified s E44.1 Mild protein-calorie malnutrition Modifier: ide Quantity: 1 Electronic Signature(s) Signed: 07/31/2015 11:07:56 AM By: Elpidio Eric BSN, RN Signed: 07/31/2015 4:22:03 PM By: Evlyn Kanner MD, FACS Previous Signature: 07/31/2015 11:00:19 AM Version By: Evlyn Kanner MD, FACS Entered By: Elpidio Eric on 07/31/2015 11:07:55

## 2015-08-08 ENCOUNTER — Encounter: Payer: Self-pay | Admitting: Urgent Care

## 2015-08-08 ENCOUNTER — Emergency Department: Payer: Medicare Other

## 2015-08-08 ENCOUNTER — Emergency Department
Admission: EM | Admit: 2015-08-08 | Discharge: 2015-08-08 | Disposition: A | Discharge status: Other Acute Inpatient Hospital | Payer: Medicare Other | Attending: Emergency Medicine | Admitting: Emergency Medicine

## 2015-08-08 DIAGNOSIS — Z7951 Long term (current) use of inhaled steroids: Secondary | ICD-10-CM | POA: Insufficient documentation

## 2015-08-08 DIAGNOSIS — R410 Disorientation, unspecified: Secondary | ICD-10-CM | POA: Diagnosis not present

## 2015-08-08 DIAGNOSIS — I10 Essential (primary) hypertension: Secondary | ICD-10-CM | POA: Diagnosis not present

## 2015-08-08 DIAGNOSIS — R0600 Dyspnea, unspecified: Secondary | ICD-10-CM

## 2015-08-08 DIAGNOSIS — J45901 Unspecified asthma with (acute) exacerbation: Secondary | ICD-10-CM | POA: Insufficient documentation

## 2015-08-08 DIAGNOSIS — Z79899 Other long term (current) drug therapy: Secondary | ICD-10-CM | POA: Insufficient documentation

## 2015-08-08 DIAGNOSIS — R451 Restlessness and agitation: Secondary | ICD-10-CM | POA: Insufficient documentation

## 2015-08-08 DIAGNOSIS — R0602 Shortness of breath: Secondary | ICD-10-CM | POA: Diagnosis present

## 2015-08-08 LAB — COMPREHENSIVE METABOLIC PANEL
ALBUMIN: 3.8 g/dL (ref 3.5–5.0)
ALK PHOS: 97 U/L (ref 38–126)
ALT: 20 U/L (ref 17–63)
ANION GAP: 7 (ref 5–15)
AST: 19 U/L (ref 15–41)
BILIRUBIN TOTAL: 0.1 mg/dL — AB (ref 0.3–1.2)
BUN: 22 mg/dL — AB (ref 6–20)
CALCIUM: 8.5 mg/dL — AB (ref 8.9–10.3)
CO2: 23 mmol/L (ref 22–32)
Chloride: 105 mmol/L (ref 101–111)
Creatinine, Ser: 0.53 mg/dL — ABNORMAL LOW (ref 0.61–1.24)
GFR calc Af Amer: >60 mL/min (ref >60)
GFR calc non Af Amer: >60 mL/min (ref >60)
GLUCOSE: 106 mg/dL — AB (ref 65–99)
POTASSIUM: 4.3 mmol/L (ref 3.5–5.1)
SODIUM: 135 mmol/L (ref 135–145)
TOTAL PROTEIN: 7.1 g/dL (ref 6.5–8.1)

## 2015-08-08 LAB — CBC WITH DIFFERENTIAL/PLATELET
Basophils Absolute: 0 10*3/uL (ref 0–0.1)
Basophils Relative: 1 %
EOS ABS: 0.7 10*3/uL (ref 0–0.7)
EOS PCT: 9 %
HCT: 45.2 % (ref 40.0–52.0)
Hemoglobin: 14.5 g/dL (ref 13.0–18.0)
LYMPHS ABS: 0.9 10*3/uL — AB (ref 1.0–3.6)
Lymphocytes Relative: 12 %
MCH: 25.4 pg — AB (ref 26.0–34.0)
MCHC: 32 g/dL (ref 32.0–36.0)
MCV: 79.4 fL — ABNORMAL LOW (ref 80.0–100.0)
MONOS PCT: 14 %
Monocytes Absolute: 1.1 10*3/uL — ABNORMAL HIGH (ref 0.2–1.0)
Neutro Abs: 5.1 10*3/uL (ref 1.4–6.5)
Neutrophils Relative %: 66 %
Platelets: 160 10*3/uL (ref 150–440)
RBC: 5.7 MIL/uL (ref 4.40–5.90)
RDW: 18.4 % — ABNORMAL HIGH (ref 11.5–14.5)
WBC: 7.7 10*3/uL (ref 3.8–10.6)

## 2015-08-08 LAB — URINALYSIS COMPLETE WITH MICROSCOPIC (ARMC ONLY)
BACTERIA UA: NONE SEEN
BILIRUBIN URINE: NEGATIVE
Glucose, UA: NEGATIVE mg/dL
HGB URINE DIPSTICK: NEGATIVE
Ketones, ur: NEGATIVE mg/dL
LEUKOCYTES UA: NEGATIVE
Nitrite: NEGATIVE
PH: 6 (ref 5.0–8.0)
Protein, ur: NEGATIVE mg/dL
Specific Gravity, Urine: 1.02 (ref 1.005–1.030)

## 2015-08-08 LAB — BRAIN NATRIURETIC PEPTIDE: B Natriuretic Peptide: 60 pg/mL (ref 0.0–100.0)

## 2015-08-08 LAB — TROPONIN I: Troponin I: <0.03 ng/mL (ref <0.031)

## 2015-08-08 MED ORDER — SODIUM CHLORIDE 0.9 % IV BOLUS (SEPSIS)
500.0000 mL | Freq: Once | INTRAVENOUS | Status: AC
  Administered 2015-08-08: 500 mL via INTRAVENOUS

## 2015-08-08 MED ORDER — IPRATROPIUM-ALBUTEROL 0.5-2.5 (3) MG/3ML IN SOLN
3.0000 mL | Freq: Once | RESPIRATORY_TRACT | Status: AC
  Administered 2015-08-08: 3 mL via RESPIRATORY_TRACT
  Filled 2015-08-08: qty 3

## 2015-08-08 MED ORDER — SODIUM CHLORIDE 0.9 % IV BOLUS (SEPSIS)
500.0000 mL | Freq: Once | INTRAVENOUS | Status: AC
Start: 2015-08-08 — End: 2015-08-08
  Administered 2015-08-08: 500 mL via INTRAVENOUS

## 2015-08-08 MED ORDER — LORAZEPAM 2 MG/ML IJ SOLN
INTRAMUSCULAR | Status: AC
  Administered 2015-08-08: 1 mg via INTRAVENOUS
  Filled 2015-08-08: qty 1

## 2015-08-08 MED ORDER — LORAZEPAM 2 MG/ML IJ SOLN
1.0000 mg | Freq: Once | INTRAMUSCULAR | Status: AC
  Administered 2015-08-08: 1 mg via INTRAVENOUS

## 2015-08-08 MED ORDER — METHYLPREDNISOLONE SODIUM SUCC 125 MG IJ SOLR
125.0000 mg | Freq: Once | INTRAMUSCULAR | Status: AC
  Administered 2015-08-08: 125 mg via INTRAVENOUS
  Filled 2015-08-08: qty 2

## 2015-08-08 NOTE — ED Notes (Addendum)
Patient presents to ED via EMS from [place]. EMS called out in reference to patient being SOB; patient with sats in the 70s per EMS. Patient combative and refused care en route. EMS reports that [facility] reported to them that patient is normally calm and cooperative. Patient presents to the ED with audible expiratory wheezing and wet sounding cough. Patient combative upon arrival AEB by him hitting, yelling, and attempting to bite staff. Patient presents with wound vac in place to sacrum.

## 2015-08-08 NOTE — ED Provider Notes (Signed)
----------------------------------------- 10:46 AM on 08/08/2015 -----------------------------------------   Blood pressure 146/91, pulse 40, temperature 97.4 F (36.3 C), temperature source Rectal, resp. rate 25, weight 145 lb (65.772 kg), SpO2 93 %.  Assuming care from Dr. Manson Passey.  In short, Bayan Kushnir is a 82 y.o. male with a chief complaint of Shortness of Breath and Altered Mental Status .  Refer to the original H&P for additional details.  The current plan of care is to follow patient's chest x-ray etc. The patient is an 82 year old male who was transferred to my care of this morning for continued evaluation of shortness of breath and possible altered mental status. Patient has a previous history of cerebrovascular accident with some residual left-sided weakness. Patient has a known history of atrial fibrillation. According to the nursing home he can be somewhat agitated at times however this morning he presented combative. He was given Ativan 1 mg IV my colleague to help control his combativeness and to awaken proceed with further evaluation. The family states this is uncharacteristic and they've also noticed that he's had some mild hallucinations this morning. No obvious new focal weakness at this time.   Review of his laboratory work shows no significant findings.  CT HEAD WITHOUT CONTRAST  TECHNIQUE: Contiguous axial images were obtained from the base of the skull through the vertex without intravenous contrast.  COMPARISON: None.  FINDINGS: A VP shunt catheter is in place entering the cranium via right frontal approach, entering the frontal horn of the right lateral ventricle and with its tip at the midline. Ventricular system is dilated. There are no prior studies available for comparison.  There is vasogenic edema and effacement of the sulci in the right occipital and posterior parietal lobes. Somewhat higher density Hounsfield unit measurements within the sulci of the  right occipital lobe are felt to reflect underlying edema rather then extra-axial hemorrhage.  Global atrophy and chronic ischemic changes in the periventricular white matter are superimposed.  No midline shift. No acute intracranial hemorrhage.  IMPRESSION: Ventricular system is dilated and VP shunt is in place. There are no prior studies available for comparison. Shunt malfunction cannot be excluded.  There is vasogenic edema in the right occipital and posterior parietal lobes. This is nonspecific and differential diagnosis includes an inflammatory process, underlying mass, venous thrombosis with infarct, or transependymal edema from hydrocephalus.   Electronically Signed By: Jolaine Click M.D. On: 08/08/2015 07:35          DG Chest Port 1 View (Final result) Result time: 08/08/15 06:50:14   Final result by Rad Results In Interface (08/08/15 06:50:14)   Narrative:   CLINICAL DATA: Wheezing, shortness of breath, left-sided paralysis.  EXAM: PORTABLE CHEST 1 VIEW  COMPARISON: 03/20/2015  FINDINGS: Cervical spine fusion is noted. Probable VP shunt catheter transverses the right thorax.  Cardiomediastinal silhouette is normal. Mediastinal contours appear intact.  There is no evidence of focal airspace consolidation, pleural effusion or pneumothorax, accounting for exclusion of the left costophrenic angle due to collimation.  Osseous structures are without acute abnormality. Soft tissues are grossly normal.  IMPRESSION: No active disease.    EKG: Atrial fibrillation without any acute ischemic change Rate 120 Time 6:16 AM QT interval 331 ms QRS 96 ms ST changes likely rate dependent no significant ST elevation or depression   ED course The patient was given a DuoNeb for some wheezing and noticed on my physical exam was also given Solu-Medrol 125 mg IV. To his altered mental status with a wide ranging  differential the patient was sent for head CT  evaluation. The CAT scan reading the above showed possible issues with his ventriculoperitoneal shunt. I was able to palpate shunt without obstruction and does not appear to be tender at this time. I do not have current neurosurgery available here for bedside consultation or further assessment. The case was reviewed briefly with the [facility] on call neurosurgeon Dr. Lucianne Muss. He pulled up the patient's previous head CT did not show any changes of significance my description. He felt an EDT ED transfer to [facility] was appropriate. Most likely needs an MRI and further assessment of this altered mental status. His rectal temperature was afebrile was guaiac negative. Patient was also given a 500 mL fluid bolus.  I reviewed the case with the [facility] emergency physician Dr. Colletta [place] The patient has been accepted in transfer to [facility] due to capability is not available here at [facility]. Patient's currently hemodynamically stable.     Jennye Moccasin, MD 08/08/15 1055

## 2015-08-08 NOTE — ED Notes (Signed)
Patient awake and more verbal; patient is speaking about random topics at the present. Patient asking to speak to the "little black man who is frying fish". MD made aware - MD back to bedside for reassessment now that patient is more calm.

## 2015-08-08 NOTE — ED Notes (Signed)
Patient much more calm following IV Ativan. Patient responds appropriately to verbal and tactile stimulation. Patient will wake and begin attempting to yell at RN once again. Labs pending. No anticipated needs at this time. Will continue to monitor.

## 2015-08-08 NOTE — ED Notes (Signed)
Spoke with Raynelle Fanning at [employer] to inquire about patients baseline status. Per Raynelle Fanning, patient is normally combative if he does not want to do something. Raynelle Fanning also reports that when review his notes this morning she saw an order to increase his breathing treatments and an order for cough syrup from a few days ago but there was no documentation as to why this time was done. Patient had a low grade fever last PM and was given tylenol for the fever.

## 2015-08-08 NOTE — ED Notes (Signed)
Bedside report given to [facility] 

## 2015-08-08 NOTE — ED Provider Notes (Signed)
[facility] Emergency Department Provider Note  ____________________________________________  Time seen: 6:00 AM  I have reviewed the triage vital signs and the nursing notes.  History of physical exam limited secondary to altered mental status and combativeness. HISTORY  Chief Complaint No chief complaint on file.     HPI [name] [name] is a 82 y.o. male presents via EMS from nursing home with complaint of dyspnea and fever. Per EMS the patient normal baseline mental status is pleasant however patient is combative this morning.     Past Medical History  Diagnosis Date  . Stroke (HCC)   . Hypertension   . GERD (gastroesophageal reflux disease)   . Hemorrhoids   . Asthma   . BPH (benign prostatic hyperplasia)   . VP (ventriculoperitoneal) shunt status   . Sacral decubitus ulcer     Patient Active Problem List   Diagnosis Date Noted  . GI bleed 05/11/2015  . Pressure ulcer 05/11/2015    No past surgical history on file.  Current Outpatient Rx  Name  Route  Sig  Dispense  Refill  . acetaminophen (TYLENOL) 500 MG tablet   Per NG tube   1,000 mg by Per NG tube route every 8 (eight) hours.         Marland Kitchen amLODipine (NORVASC) 10 MG tablet   Per NG tube   10 mg by Per NG tube route daily.         Marland Kitchen ascorbic acid (VITAMIN C) 500 MG/5ML syrup   Per Tube   Place 500 mg into feeding tube 2 (two) times daily.         . brimonidine (ALPHAGAN) 0.2 % ophthalmic solution   Both Eyes   Place 1 drop into both eyes 2 (two) times daily.         . cholecalciferol (VITAMIN D) 1000 UNITS tablet   Per Tube   Place 1,000 Units into feeding tube daily.          . collagenase (SANTYL) ointment   Topical   Apply 1 application topically 3 (three) times a week. Apply to ischial wound on Monday, Wednesday, and Friday, every day shift         . ferrous sulfate 325 (65 FE) MG tablet   Per Tube   Place 325 mg into feeding tube 2 (two) times daily.          . fluticasone (FLONASE) 50 MCG/ACT nasal spray   Each Nare   Place 1 spray into both nostrils daily.         Marland Kitchen guaiFENesin (ROBITUSSIN) 100 MG/5ML SOLN   Oral   Take 20 mLs by mouth every 6 (six) hours as needed for cough or to loosen phlegm.         . hydrALAZINE (APRESOLINE) 25 MG tablet   Per Tube   Place 25 mg into feeding tube 4 (four) times daily.          Marland Kitchen ipratropium-albuterol (DUONEB) 0.5-2.5 (3) MG/3ML SOLN   Nebulization   Take 3 mLs by nebulization every 6 (six) hours as needed (shortness of breath or wheezing).         Marland Kitchen lamoTRIgine (LAMICTAL) 25 MG tablet   Per Tube   Place 25 mg into feeding tube daily.          . lansoprazole (PREVACID SOLUTAB) 30 MG disintegrating tablet   Oral   Take 1 tablet (30 mg total) by mouth 2 (two) times daily.   60  tablet   0   . latanoprost (XALATAN) 0.005 % ophthalmic solution   Both Eyes   Place 1 drop into both eyes daily.          Marland Kitchen lidocaine (XYLOCAINE) 5 % ointment   Topical   Apply 1 application topically every Monday, Wednesday, and Friday. Apply to sacral wound every day shift for before wound vac change.         . loperamide (IMODIUM A-D) 2 MG tablet   Oral   Take 2 mg by mouth every 4 (four) hours as needed for diarrhea or loose stools.         Marland Kitchen loperamide (IMODIUM) 1 MG/5ML solution   Oral   Take 5 mLs by mouth every 8 (eight) hours as needed for diarrhea or loose stools.          . Menthol, Topical Analgesic, (BIOFREEZE) 4 % GEL   Apply externally   Apply 1 application topically every 8 (eight) hours.         . Multiple Vitamin (MULTIVITAMIN) LIQD   PEG Tube   5 mLs by PEG Tube route daily.          . potassium chloride 20 MEQ/15ML (10%) SOLN   Per Tube   Place 20 mEq into feeding tube daily.          . Probiotic Product (ALIGN) 4 MG CAPS   Oral   Take 4 mg by mouth daily.         Marland Kitchen selenium 50 MCG TABS tablet   Per Tube   Place 50 mcg into feeding tube daily.           . sodium chloride (OCEAN) 0.65 % SOLN nasal spray   Each Nare   Place 2 sprays into both nostrils every 8 (eight) hours as needed for congestion.         . traMADol (ULTRAM) 50 MG tablet   Per Tube   Place 50 mg into feeding tube every 8 (eight) hours as needed for moderate pain or severe pain.          Marland Kitchen zinc sulfate 220 MG capsule   Oral   Take 220 mg by mouth daily.           Allergies Atorvastatin; Prednisone; Silodosin; Warfarin sodium; Gabapentin; Metoprolol tartrate; Orange juice; Pregabalin; Flomax ; and Morphine and related  Family History  Problem Relation Age of Onset  . CAD Mother     Social History Social History  Substance Use Topics  . Smoking status: Never Smoker   . Smokeless tobacco: Not on file  . Alcohol Use: No    Review of Systems  Constitutional: Negative for fever. Eyes: Negative for visual changes. ENT: Negative for sore throat. Cardiovascular: Negative for chest pain. Respiratory: Positive for shortness of breath. Gastrointestinal: Negative for abdominal pain, vomiting and diarrhea. Genitourinary: Negative for dysuria. Musculoskeletal: Negative for back pain. Skin: Negative for rash. Neurological: Negative for headaches, focal weakness or numbness.   10-point ROS otherwise negative.  ____________________________________________   PHYSICAL EXAM:  VITAL SIGNS: ED Triage Vitals  Enc Vitals Group     BP --      Pulse --      Resp --      Temp --      Temp src --      SpO2 --      Weight --      Height --      Head Cir --  Peak Flow --      Pain Score --      Pain Loc --      Pain Edu? --      Excl. in GC? --      Constitutional: Alert but confused Eyes: Conjunctivae are normal. PERRL. Normal extraocular movements. ENT   Head: Normocephalic and atraumatic.   Nose: No congestion/rhinnorhea.   Mouth/Throat: Mucous membranes are moist.   Neck: No stridor. Hematological/Lymphatic/Immunilogical:  No cervical lymphadenopathy. Cardiovascular: Irregular regular rhythm. Normal and symmetric distal pulses are present in all extremities. No murmurs, rubs, or gallops. Respiratory: Normal respiratory effort without tachypnea nor retractions. Breath sounds are clear and equal bilaterally. Bibasilar rhonchi Gastrointestinal: Soft and nontender. No distention. There is no CVA tenderness. Genitourinary: deferred Musculoskeletal: Nontender with normal range of motion in all extremities. No joint effusions.  No lower extremity tenderness nor edema. Neurologic:  Normal speech and language. No gross focal neurologic deficits are appreciated. Speech is normal.  Skin:  Skin is warm, dry and intact. No rash noted. Psychiatric: Combative and agitated.  ____________________________________________    LABS (pertinent positives/negatives)    ____________________________________________   EKG  ED ECG REPORT I, Tong Pieczynski, [place], the attending physician, personally viewed and interpreted this ECG.   Date: 08/09/2015  EKG Time: 6:16 AM  Rate: 120  Rhythm: Atrial fibrillation rapid ventricular response  Axis: None  Intervals: Irregular  ST&T Change: None   ____________________________________________    RADIOLOGY      CT Head Wo Contrast (Final result) Result time: 08/08/15 07:35:04   Final result by Rad Results In Interface (08/08/15 07:35:04)   Narrative:   CLINICAL DATA: Short of breath  EXAM: CT HEAD WITHOUT CONTRAST  TECHNIQUE: Contiguous axial images were obtained from the base of the skull through the vertex without intravenous contrast.  COMPARISON: None.  FINDINGS: A VP shunt catheter is in place entering the cranium via right frontal approach, entering the frontal horn of the right lateral ventricle and with its tip at the midline. Ventricular system is dilated. There are no prior studies available for comparison.  There is vasogenic edema and effacement of  the sulci in the right occipital and posterior parietal lobes. Somewhat higher density Hounsfield unit measurements within the sulci of the right occipital lobe are felt to reflect underlying edema rather then extra-axial hemorrhage.  Global atrophy and chronic ischemic changes in the periventricular white matter are superimposed.  No midline shift. No acute intracranial hemorrhage.  IMPRESSION: Ventricular system is dilated and VP shunt is in place. There are no prior studies available for comparison. Shunt malfunction cannot be excluded.  There is vasogenic edema in the right occipital and posterior parietal lobes. This is nonspecific and differential diagnosis includes an inflammatory process, underlying mass, venous thrombosis with infarct, or transependymal edema from hydrocephalus.   Electronically Signed By: Jolaine Click M.D. On: 08/08/2015 07:35          DG Chest Port 1 View (Final result) Result time: 08/08/15 06:50:14   Final result by Rad Results In Interface (08/08/15 06:50:14)   Narrative:   CLINICAL DATA: Wheezing, shortness of breath, left-sided paralysis.  EXAM: PORTABLE CHEST 1 VIEW  COMPARISON: 03/20/2015  FINDINGS: Cervical spine fusion is noted. Probable VP shunt catheter transverses the right thorax.  Cardiomediastinal silhouette is normal. Mediastinal contours appear intact.  There is no evidence of focal airspace consolidation, pleural effusion or pneumothorax, accounting for exclusion of the left costophrenic angle due to collimation.  Osseous structures are without  acute abnormality. Soft tissues are grossly normal.  IMPRESSION: No active disease.   Electronically Signed By: Ted Mcalpine M.D. On: 08/08/2015 06:50          INITIAL IMPRESSION / ASSESSMENT AND PLAN / ED COURSE  Pertinent labs & imaging results that were available during my care of the patient were reviewed by me and considered in my  medical decision making (see chart for details).  Patient care is transferred to Dr. Huel Cote  ____________________________________________   FINAL CLINICAL IMPRESSION(S) / ED DIAGNOSES  Final diagnoses:  Dyspnea  Delirium      Darci Current, MD 08/09/15 [phone]

## 2015-08-08 NOTE — ED Notes (Signed)
Patient to CT at this time for MD ordered imaging study.

## 2015-08-08 NOTE — ED Notes (Signed)
MD with VORB for 500cc NS bolus. Order to be entered and carried by this RN.

## 2015-08-14 ENCOUNTER — Ambulatory Visit: Payer: Medicare Other | Admitting: Surgery

## 2016-01-05 IMAGING — CR DG ABDOMEN 1V
1 series · 3 of 3 positions shown · non-contrast
Comparison: Abdominal radiograph May 14, 2015

CLINICAL DATA: Confirm G-tube replacement.

EXAM:
ABDOMEN - 1 VIEW

[Series 1: ap · 0.17mm/px · 3 of 3 slices shown]
[im 1/3]
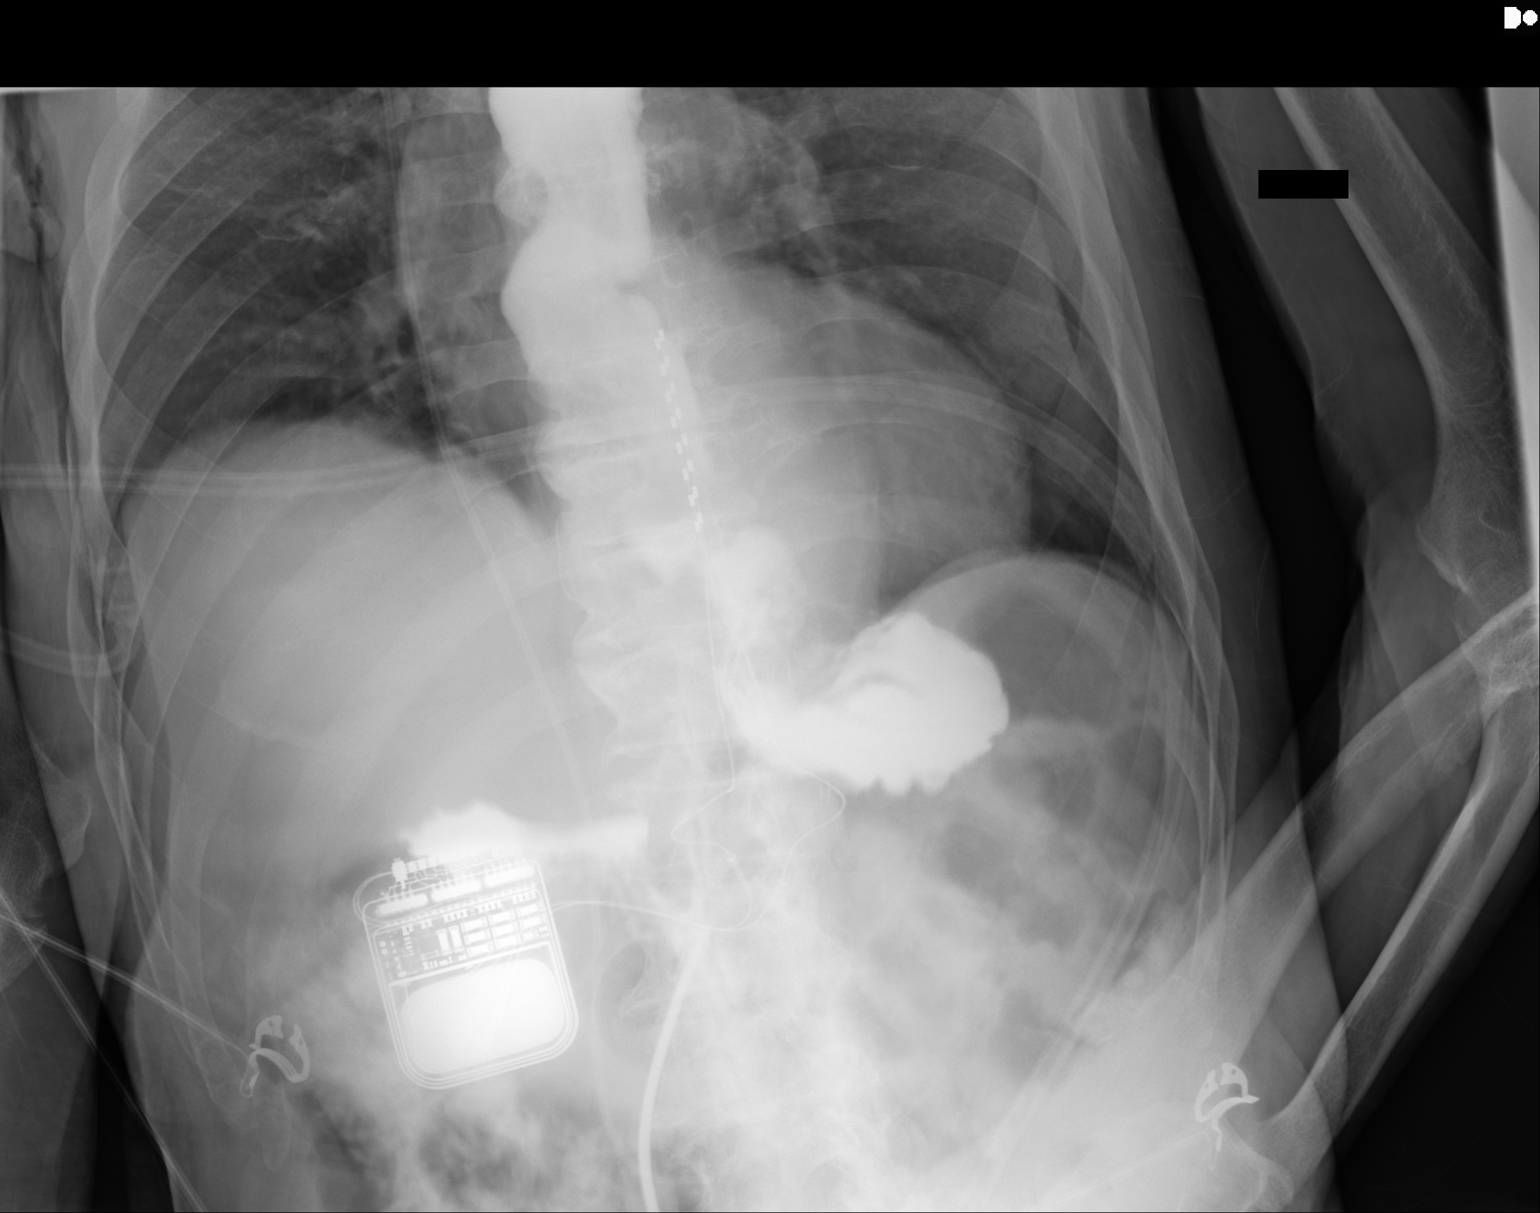
[im 2/3]
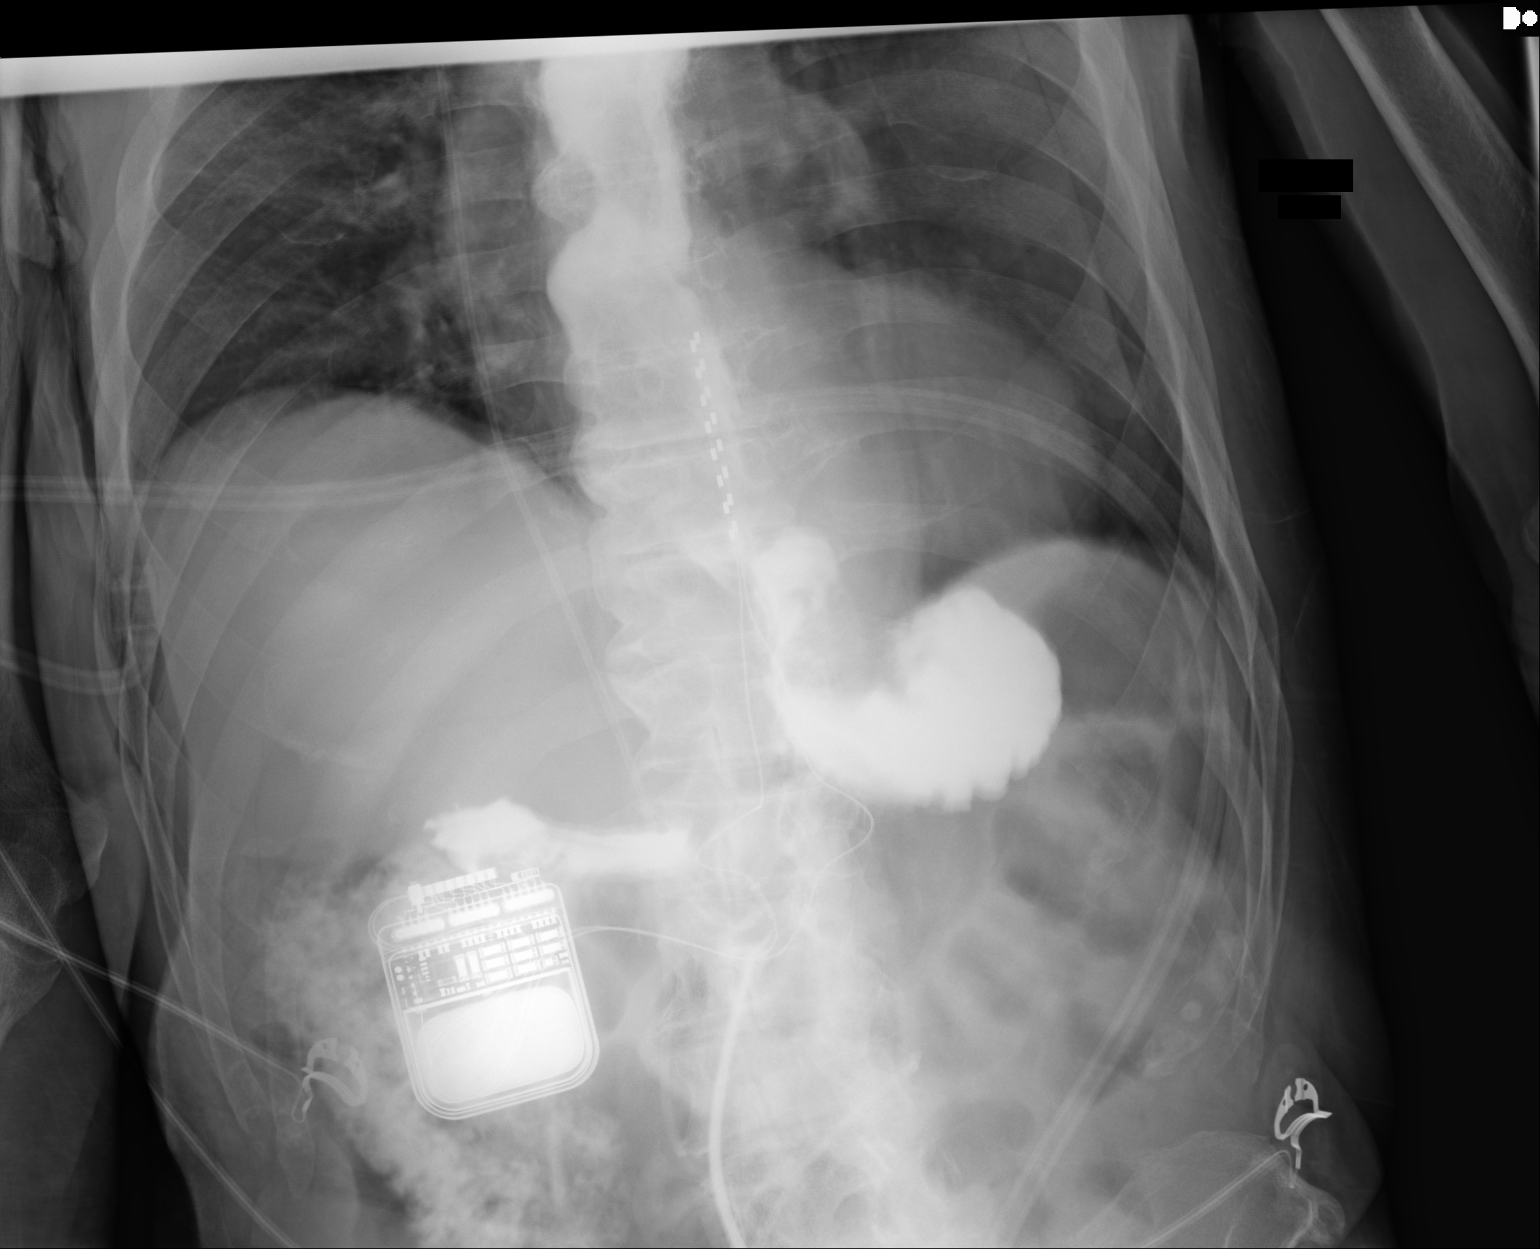
[im 3/3]
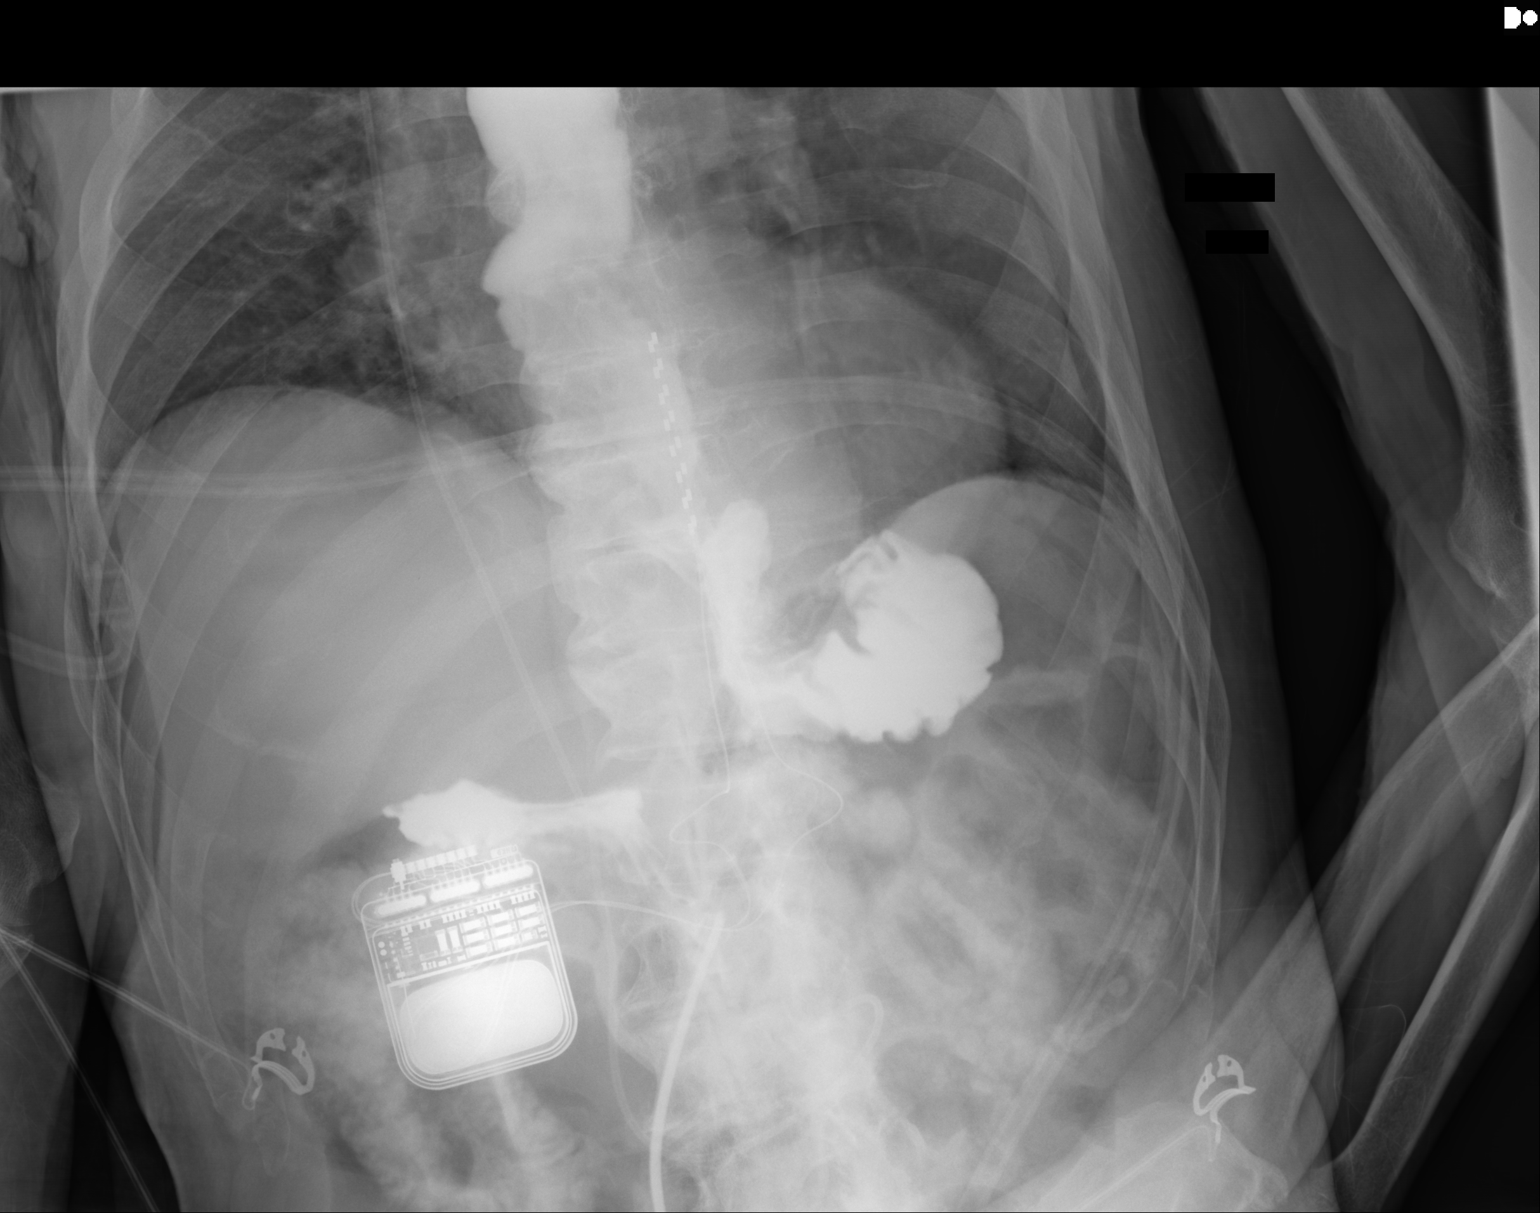

[3 of 3 positions shown; findings below may reference images not displayed]

FINDINGS: Intra luminal gastric contrast. Rounded lucency projecting at
gastric antrum likely represents retaining bulb. Included bowel gas
pattern is nondilated and nonobstructive. Ventriculoperitoneal
catheter courses in RIGHT chest and abdomen. Spinal stimulator in
situ
IMPRESSION: Intraluminal gastrostomy tube.

## 2016-02-10 DEATH — deceased

## 2016-03-06 IMAGING — CT CT HEAD W/O CM
4 of 5 series · 17 of 30 positions shown, 18 images · non-contrast
Comparison: None.

CLINICAL DATA: Short of breath

EXAM:
CT HEAD WITHOUT CONTRAST
TECHNIQUE: Contiguous axial images were obtained from the base of the skull
through the vertex without intravenous contrast.

[Series 2: head bone · axial · 0.45mm/px · z∈[-114,+18]mm · 8 of 84 slices shown (1 of 2)]
[im 9/84  bone]
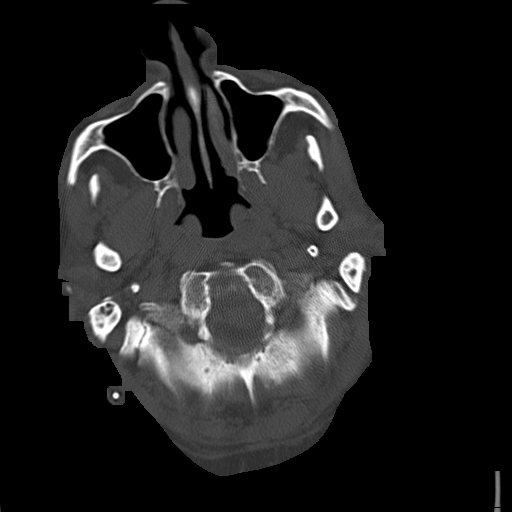
[im 17/84  bone]
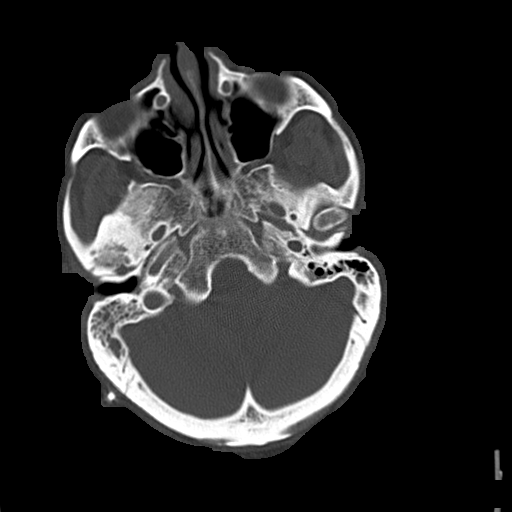
[im 25/84  bone]
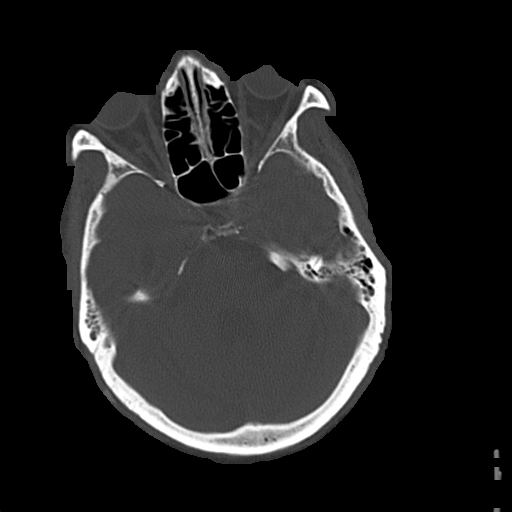
[im 34/84  bone]
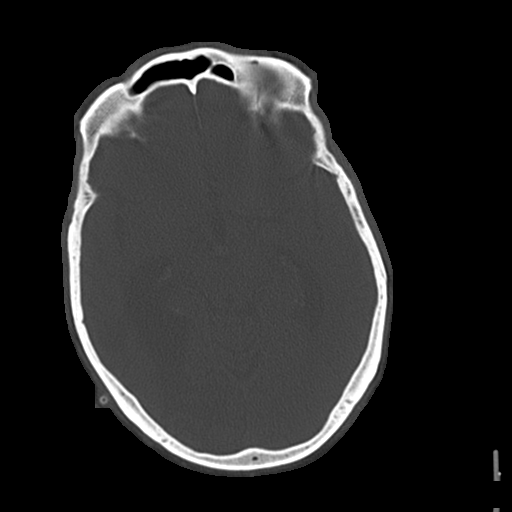
[im 50/84  bone]
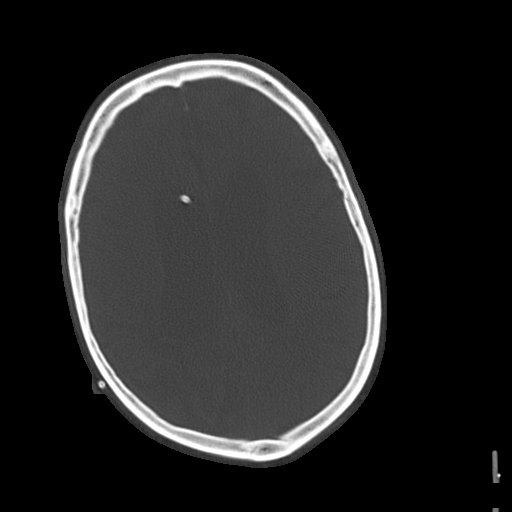
[im 59/84  bone]
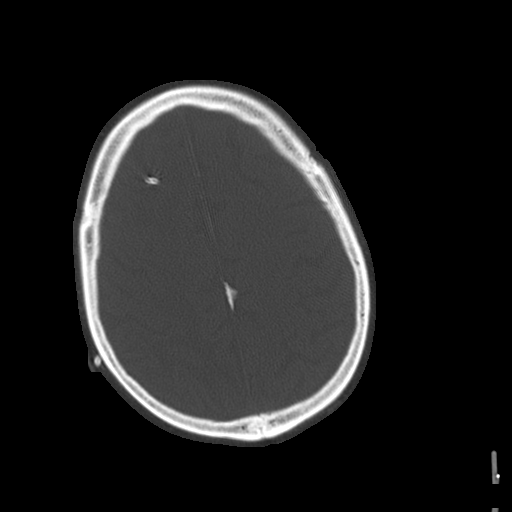
[im 67/84  bone]
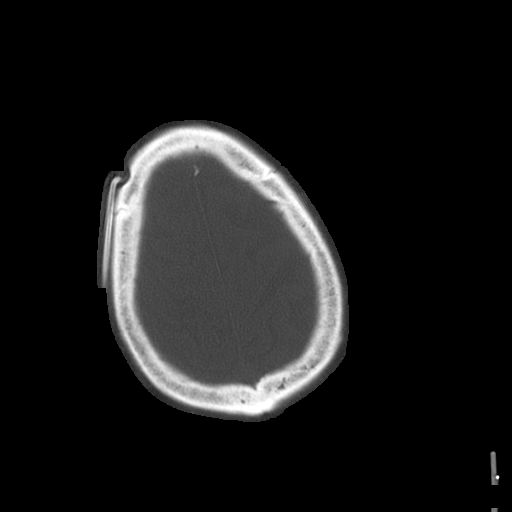
[im 75/84  bone]
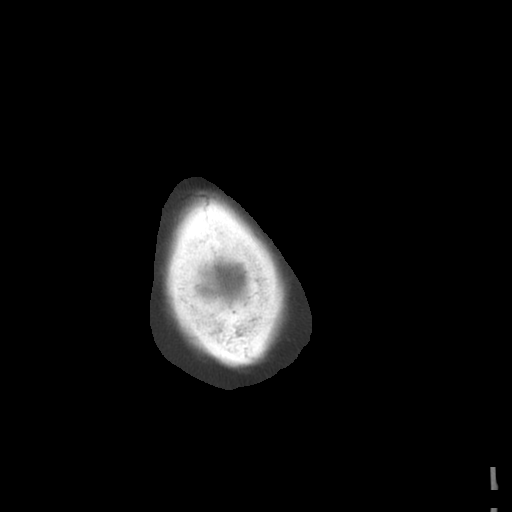

[Series 3: head wo · axial · 0.45mm/px · z∈[-76,-26]mm · 2 of 30 slices shown]
[im 10/30  brain]
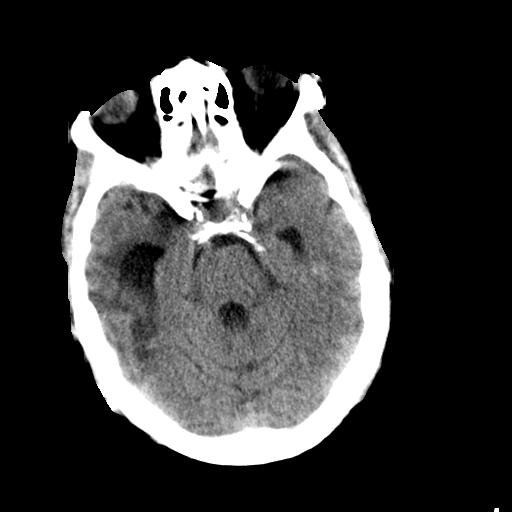
[im 20/30  brain]
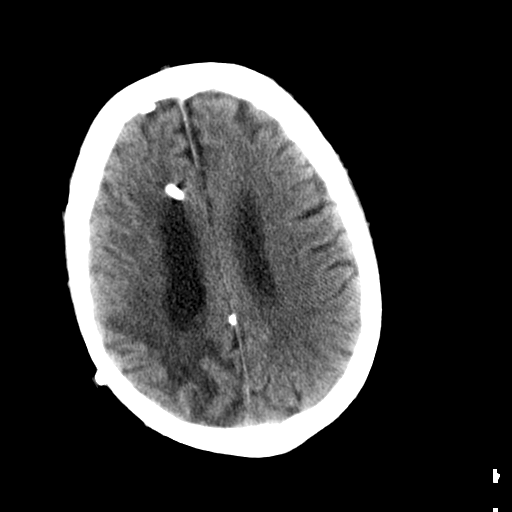

[Series 4: head bone · axial · 0.45mm/px · z∈[-58,+4]mm · 5 of 55 slices shown (2 of 2)]
[im 8/55  bone]
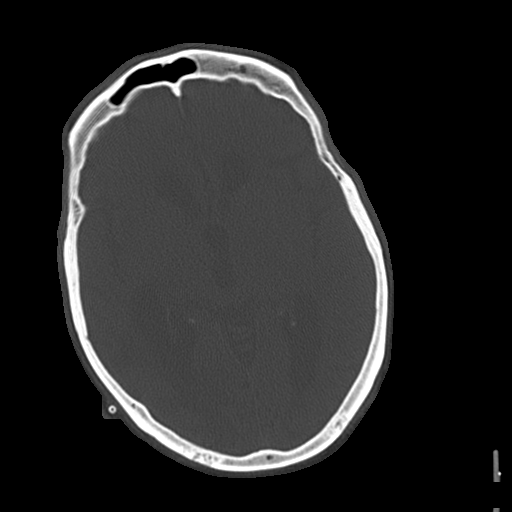
[im 16/55  bone]
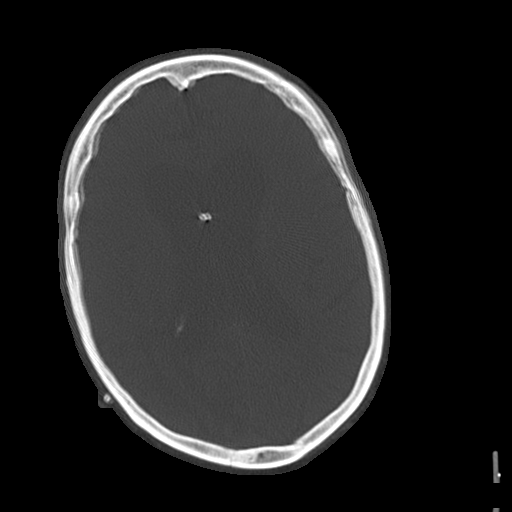
[im 24/55  bone]
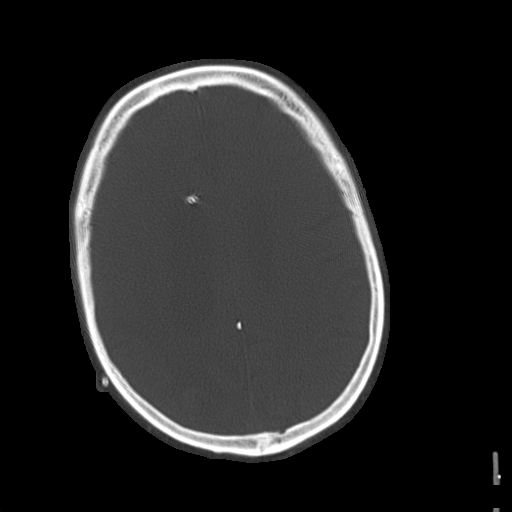
[im 31/55  bone]
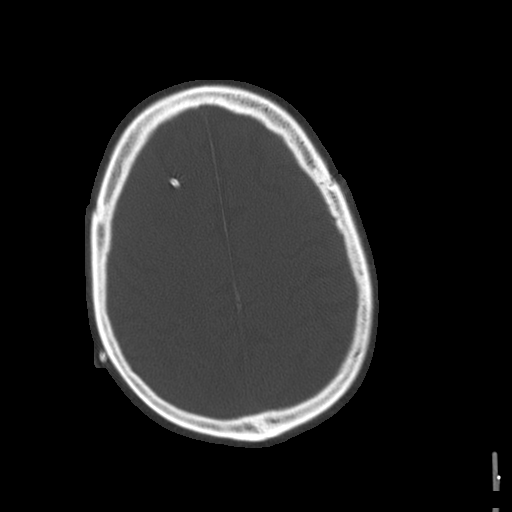
[im 39/55  bone]
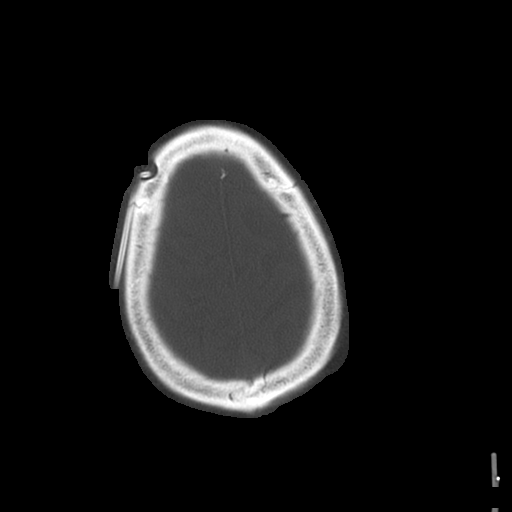

[Series 6: head wo 2 · axial · 0.36mm/px · z∈[-75,-25]mm · 2 of 32 slices shown, 3 images]
[im 11/32  brain]
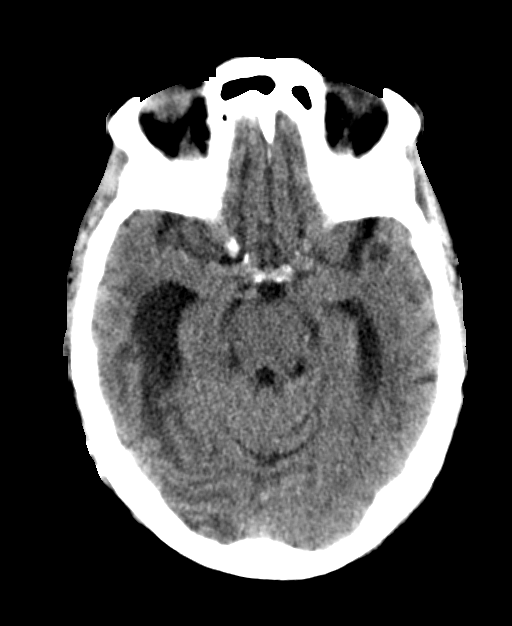
[im 11/32  bone]
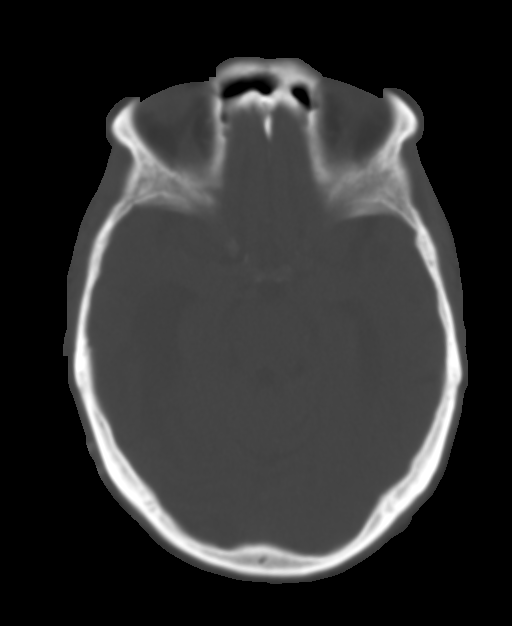
[im 21/32  brain]
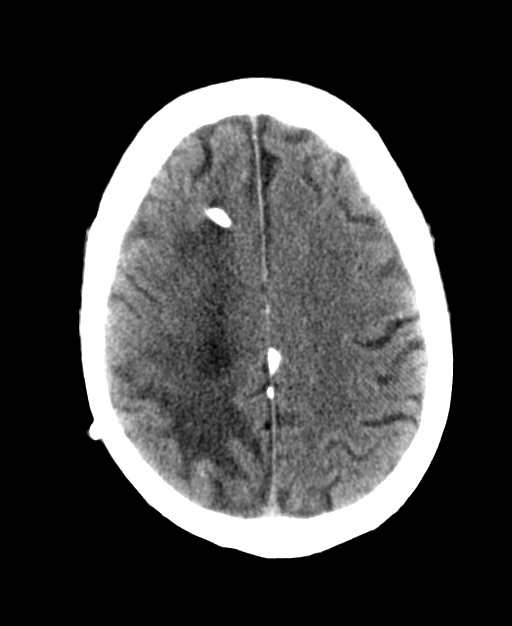

[17 of 30 positions shown; findings below may reference images not displayed]

FINDINGS: A VP shunt catheter is in place entering the cranium via right
frontal approach, entering the frontal horn of the right lateral
ventricle and with its tip at the midline. Ventricular system is
dilated. There are no prior studies available for comparison.

There is vasogenic edema and effacement of the sulci in the right
occipital and posterior parietal lobes. Somewhat higher density
Hounsfield unit measurements within the sulci of the right occipital
lobe are felt to reflect underlying edema rather then extra-axial
hemorrhage.

Global atrophy and chronic ischemic changes in the periventricular
white matter are superimposed.

No midline shift.  No acute intracranial hemorrhage.
IMPRESSION: Ventricular system is dilated and VP shunt is in place. There are no
prior studies available for comparison. Shunt malfunction cannot be
excluded.

There is vasogenic edema in the right occipital and posterior
parietal lobes. This is nonspecific and differential diagnosis
includes an inflammatory process, underlying mass, venous thrombosis
with infarct, or transependymal edema from hydrocephalus.
